# Patient Record
Sex: Female | Born: 1975 | Race: White | Hispanic: No | Marital: Married | Smoking: Current every day smoker
Health system: Southern US, Community
[De-identification: ages and names within clinical notes are randomized; demographics above are authoritative.]

## PROBLEM LIST (undated history)

## (undated) DIAGNOSIS — N644 Mastodynia: Secondary | ICD-10-CM

## (undated) DIAGNOSIS — L900 Lichen sclerosus et atrophicus: Secondary | ICD-10-CM

## (undated) DIAGNOSIS — D649 Anemia, unspecified: Secondary | ICD-10-CM

## (undated) DIAGNOSIS — F419 Anxiety disorder, unspecified: Secondary | ICD-10-CM

## (undated) DIAGNOSIS — F322 Major depressive disorder, single episode, severe without psychotic features: Secondary | ICD-10-CM

## (undated) DIAGNOSIS — J069 Acute upper respiratory infection, unspecified: Secondary | ICD-10-CM

## (undated) DIAGNOSIS — IMO0002 Reserved for concepts with insufficient information to code with codable children: Secondary | ICD-10-CM

## (undated) DIAGNOSIS — L409 Psoriasis, unspecified: Secondary | ICD-10-CM

## (undated) DIAGNOSIS — R87619 Unspecified abnormal cytological findings in specimens from cervix uteri: Secondary | ICD-10-CM

## (undated) DIAGNOSIS — H538 Other visual disturbances: Secondary | ICD-10-CM

## (undated) DIAGNOSIS — M1990 Unspecified osteoarthritis, unspecified site: Secondary | ICD-10-CM

## (undated) DIAGNOSIS — R339 Retention of urine, unspecified: Secondary | ICD-10-CM

## (undated) DIAGNOSIS — B069 Rubella without complication: Secondary | ICD-10-CM

## (undated) DIAGNOSIS — O09529 Supervision of elderly multigravida, unspecified trimester: Secondary | ICD-10-CM

## (undated) DIAGNOSIS — L4050 Arthropathic psoriasis, unspecified: Secondary | ICD-10-CM

## (undated) DIAGNOSIS — Z8619 Personal history of other infectious and parasitic diseases: Secondary | ICD-10-CM

## (undated) DIAGNOSIS — K644 Residual hemorrhoidal skin tags: Secondary | ICD-10-CM

## (undated) HISTORY — PX: LASIK: SHX215

## (undated) HISTORY — PX: CRYOTHERAPY: SHX1416

## (undated) HISTORY — DX: Retention of urine, unspecified: R33.9

## (undated) HISTORY — DX: Unspecified abnormal cytological findings in specimens from cervix uteri: R87.619

## (undated) HISTORY — DX: Mastodynia: N64.4

## (undated) HISTORY — DX: Major depressive disorder, single episode, severe without psychotic features: F32.2

## (undated) HISTORY — PX: FOOT SURGERY: SHX648

## (undated) HISTORY — DX: Other visual disturbances: H53.8

## (undated) HISTORY — DX: Residual hemorrhoidal skin tags: K64.4

## (undated) HISTORY — DX: Anxiety disorder, unspecified: F41.9

## (undated) HISTORY — DX: Reserved for concepts with insufficient information to code with codable children: IMO0002

## (undated) HISTORY — DX: Unspecified osteoarthritis, unspecified site: M19.90

## (undated) HISTORY — DX: Personal history of other infectious and parasitic diseases: Z86.19

## (undated) HISTORY — DX: Rubella without complication: B06.9

## (undated) HISTORY — DX: Anemia, unspecified: D64.9

## (undated) HISTORY — DX: Arthropathic psoriasis, unspecified: L40.50

## (undated) HISTORY — DX: Acute upper respiratory infection, unspecified: J06.9

## (undated) HISTORY — DX: Supervision of elderly multigravida, unspecified trimester: O09.529

---
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
## 2000-03-20 ENCOUNTER — Emergency Department (HOSPITAL_COMMUNITY): Admission: EM | Admit: 2000-03-20 | Discharge: 2000-03-20 | Payer: Self-pay | Admitting: Emergency Medicine

## 2000-03-20 ENCOUNTER — Encounter: Payer: Self-pay | Admitting: Emergency Medicine

## 2000-10-07 ENCOUNTER — Other Ambulatory Visit: Admission: RE | Admit: 2000-10-07 | Discharge: 2000-10-07 | Payer: Self-pay | Admitting: Obstetrics and Gynecology

## 2002-10-19 ENCOUNTER — Emergency Department (HOSPITAL_COMMUNITY): Admission: EM | Admit: 2002-10-19 | Discharge: 2002-10-19 | Payer: Self-pay | Admitting: Emergency Medicine

## 2004-02-26 ENCOUNTER — Emergency Department (HOSPITAL_COMMUNITY): Admission: EM | Admit: 2004-02-26 | Discharge: 2004-02-26 | Payer: Self-pay | Admitting: Emergency Medicine

## 2004-03-01 ENCOUNTER — Other Ambulatory Visit: Admission: RE | Admit: 2004-03-01 | Discharge: 2004-03-01 | Payer: Self-pay | Admitting: Obstetrics and Gynecology

## 2004-03-02 ENCOUNTER — Encounter (INDEPENDENT_AMBULATORY_CARE_PROVIDER_SITE_OTHER): Payer: Self-pay | Admitting: Specialist

## 2004-03-02 ENCOUNTER — Ambulatory Visit (HOSPITAL_COMMUNITY): Admission: RE | Admit: 2004-03-02 | Discharge: 2004-03-02 | Payer: Self-pay | Admitting: Obstetrics and Gynecology

## 2008-08-22 ENCOUNTER — Other Ambulatory Visit: Admission: RE | Admit: 2008-08-22 | Discharge: 2008-08-22 | Payer: Self-pay | Admitting: Obstetrics and Gynecology

## 2010-07-17 HISTORY — PX: DILATION AND CURETTAGE OF UTERUS: SHX78

## 2011-02-01 NOTE — Op Note (Signed)
NAME:  Lindsay Griffin, Lindsay Griffin                      ACCOUNT NO.:  [id number]   MEDICAL RECORD NO.:  [id number]                   PATIENT TYPE:  AMB   LOCATION:  SDC                                  FACILITY:  [facility]   PHYSICIAN:  Hal Morales, M.D.             DATE OF BIRTH:  [birth year]   DATE OF PROCEDURE:  03/02/2004  DATE OF DISCHARGE:                                 OPERATIVE REPORT   PREOPERATIVE DIAGNOSES:  Abnormal uterine bleeding, rule out retained  products of conception.   POSTOPERATIVE DIAGNOSES:  Abnormal uterine bleeding, rule out retained  products of conception.   OPERATION:  Suction dilatation and evacuation.   SURGEON:  Hal Morales, M.D.   ANESTHESIA:  Monitored anesthesia care and local.   ESTIMATED BLOOD LOSS:  Less than 10 mL.   COMPLICATIONS:  None.   FINDINGS:  A small amount of uterine contents.   DESCRIPTION OF PROCEDURE:  The patient was taken to the operating room after  appropriate identification and placed on the operating table.  After the  attainment of monitored anesthesia care, she was placed in the lithotomy  position. The perineum and vagina were prepped with multiple layers of  Betadine and draped as a sterile field. A Graves speculum was placed in the  vagina and a single tooth tenaculum placed on the anterior cervix. A  paracervical block was achieved with a total of 10 mL of 2% Xylocaine in the  5 and 7 o'clock positions. The cervix was dilated to accommodate a #7  suction curette. The suction catheter was used to suction evacuate all  quadrants of the uterus. A sharp curette was used to ensure that all uterine  contents were removed.  Hemostasis was noted to be adequate. The patient was  taken from the operating room to the recovery room in satisfactory condition  having tolerated the procedure well with sponge and instrument counts  correct.   SPECIMENS:  Uterine content.   DISCHARGE INSTRUCTIONS:  Printed instructions from the  [facility] for  a D&C.   DISCHARGE MEDICATIONS:  Doxycycline 100 mg p.o. b.i.d. for five days.  The  patient is Rh negative and received RhoGAM after her pregnancy termination  in February.   An antibody screen will be obtained to determine whether she has adequate  coverage as a result of that RhoGAM injection.                                              Hal Morales, M.D.   VPH/MEDQ  D:  03/02/2004  T:  03/03/2004  Job:  [id number]

## 2011-02-01 NOTE — H&P (Signed)
NAME:  Lindsay Griffin, Lindsay Griffin                        ACCOUNT NO.:  [id number]   MEDICAL RECORD NO.:  [id number]                   PATIENT TYPE:   LOCATION:                                       FACILITY:   PHYSICIAN:  Hal Morales, M.D.             DATE OF BIRTH:  May 28, 1976   DATE OF ADMISSION:  03/02/2004  DATE OF DISCHARGE:                                HISTORY & PHYSICAL   HISTORY OF PRESENT ILLNESS:  The patient is a 35 year old white single  female para 5,4,0,1,4 who presents for evaluation of excessive vaginal  bleeding since an elective pregnancy termination in February 2005.  The  patient had light bleeding for approximately four weeks after her procedure.  She did not return for her scheduled postoperative appointment.  After the  four weeks of bleeding, she discontinued any bleeding for a few weeks.  Then, in April 2005 started heavy bleeding that was similar to her normal  period.  This bleeding has continued to date.  She was evaluated two weeks  ago at [facility] and restarted on Ortho Tri-Cyclen Lo on February 19, 2004.  She continues to bleed inspite of having already taken approximately 10  pills of the Ortho Tri-Cyclen Lo.   Her elective pregnancy termination was followed by a prescription for  Doxycycline for five days.  She denies any significant abdominal pain  originally, but has started to have a bit of abdominal pain over the last  several days.  She denies any nausea, vomiting, constipation or diarrhea.  Her pain is crampy in nature.  She denies any fever at home.   PAST MEDICAL HISTORY:  Significant only for bladder infection many years  ago.   SURGICAL HISTORY:  She had surgery on her foot the summer of 1994 and has  had four elective pregnancy terminations.   CURRENT MEDICATIONS:  Ortho Tri-Cyclen Lo.   ALLERGIES:  SULFA.   PAST GYN HISTORY:  The patient started her menarche at age 66 and had  regular monthly menses lasting five to six days until  her first pregnancy.  Between pregnancies she likewise had normal monthly periods without any  heavy flow, bleeding between periods or severe cramps. She is currently  sexually active and has had more than five partners in her lifetime.  She is  satisfied with her birth control pills as a method of birth control.  She  denies a history of sexually transmitted infection.   SOCIAL HISTORY:  The patient works as a Leisure centre manager and smokes cigarettes, one  pack per day, and has for the last 12 years.   FAMILY HISTORY:  Positive for migraines and cerebrovascular accident as well  as cancer.   REVIEW OF SYSTEMS:  Positive for crampy abdominal pain and the  aforementioned vaginal bleeding.   PHYSICAL EXAMINATION:  GENERAL:  The patient is a thin white female in no  acute distress.  VITAL SIGNS:  Temperature 98.7,  blood pressure 90/60, weight 108.  LUNGS:  Clear.  HEART:  Regular  rate and rhythm.  ABDOMEN:  Soft without masses or organomegaly. There is mild tenderness to  deep palpation suprapubically, but no rebound.  PELVIC:  EG/BUS is within normal limits.  The vagina shows blood in the  vault.  The cervix is nontender to lateral motion.  The uterus is normal  size to six week size with moderate tenderness to compression.  The adnexa  without masses.  Rectovaginal is without masses.   LABORATORY DATA:  Hemoglobin 11.4, white blood cell count 8.4, quantitative  HCG is 6.3.  Pelvic ultrasound, transvaginal ultrasound:  The endometrial  cavity contains mixed ecogenetic material consistent with, but not  diagnostic of retained products of conception.  There is a simple 2.2 cm  right ovarian cyst.  The endometrial thickness is approximately 1.9 cm.   IMPRESSION:  Prolonged vaginal bleeding status post elective termination of  pregnancy.   DISPOSITION:  Discussion is held with the patient concerning the  differential diagnosis of her vaginal bleeding which includes retained  products of  conceptions, subinvolution, or endomyometritis.  She is given an  injection of Rocephin 250 mg IM and a D&E is recommended.  Discussion is  held with the patient concerning indications for her procedure as well as  the risk involved which include, but are not limited to anesthesia,  bleeding, infection, damage to adjacent organs; specifically, uterine  perforation.  The patient wishes to proceed with dilatation and evacuation  at [facility] on March 02, 2004.                                               Hal Morales, M.D.    VPH/MEDQ  D:  03/01/2004  T:  03/02/2004  Job:  [id number]

## 2011-09-17 NOTE — L&D Delivery Note (Signed)
Delivery Note At 4:24 PM a viable and healthy female was delivered via Vaginal, Spontaneous Delivery (Presentation: Left Occiput Anterior).  APGAR: 8, 9; weight pending.   Placenta status: spontaneous, intact .  Cord:  with the following complications: none.  Cord pH: na  Anesthesia: Epidural  Episiotomy: none Lacerations: second degree Suture Repair: 2.0 vicryl rapide Est. Blood Loss (mL): 300  Mom to postpartum.  Baby to nursery-stable.  Modest Draeger J 08/07/2012, 4:37 PM

## 2012-01-08 LAB — OB RESULTS CONSOLE ABO/RH: RH Type: POSITIVE

## 2012-01-08 LAB — OB RESULTS CONSOLE RUBELLA ANTIBODY, IGM: Rubella: IMMUNE

## 2012-01-08 LAB — OB RESULTS CONSOLE HEPATITIS B SURFACE ANTIGEN: Hepatitis B Surface Ag: NEGATIVE

## 2012-01-08 LAB — OB RESULTS CONSOLE RPR: RPR: NONREACTIVE

## 2012-01-08 LAB — OB RESULTS CONSOLE HIV ANTIBODY (ROUTINE TESTING): HIV: NONREACTIVE

## 2012-01-16 LAB — OB RESULTS CONSOLE GC/CHLAMYDIA
Chlamydia: NEGATIVE
Gonorrhea: NEGATIVE

## 2012-07-09 LAB — OB RESULTS CONSOLE GBS: GBS: NEGATIVE

## 2012-07-22 ENCOUNTER — Telehealth (HOSPITAL_COMMUNITY): Payer: Self-pay | Admitting: *Deleted

## 2012-07-22 ENCOUNTER — Encounter (HOSPITAL_COMMUNITY): Payer: Self-pay | Admitting: *Deleted

## 2012-07-22 NOTE — Telephone Encounter (Signed)
Preadmission screen  

## 2012-07-31 ENCOUNTER — Other Ambulatory Visit: Payer: Self-pay | Admitting: Obstetrics and Gynecology

## 2012-08-07 ENCOUNTER — Encounter (HOSPITAL_COMMUNITY): Payer: Self-pay | Admitting: Anesthesiology

## 2012-08-07 ENCOUNTER — Inpatient Hospital Stay (HOSPITAL_COMMUNITY): Payer: BC Managed Care – PPO | Admitting: Anesthesiology

## 2012-08-07 ENCOUNTER — Inpatient Hospital Stay (HOSPITAL_COMMUNITY)
Admission: RE | Admit: 2012-08-07 | Discharge: 2012-08-09 | DRG: 373 | Disposition: A | Discharge status: Home / Self Care | Payer: BC Managed Care – PPO | Source: Ambulatory Visit | Attending: Obstetrics and Gynecology | Admitting: Obstetrics and Gynecology

## 2012-08-07 ENCOUNTER — Encounter (HOSPITAL_COMMUNITY): Payer: Self-pay

## 2012-08-07 DIAGNOSIS — O09529 Supervision of elderly multigravida, unspecified trimester: Secondary | ICD-10-CM | POA: Diagnosis present

## 2012-08-07 LAB — CBC
MCV: 88.5 fL (ref 78.0–100.0)
Platelets: 407 10*3/uL — ABNORMAL HIGH (ref 150–400)
RBC: 3.73 MIL/uL — ABNORMAL LOW (ref 3.87–5.11)
RDW: 13 % (ref 11.5–15.5)
WBC: 14.6 10*3/uL — ABNORMAL HIGH (ref 4.0–10.5)

## 2012-08-07 LAB — RPR: RPR Ser Ql: NONREACTIVE

## 2012-08-07 MED ORDER — FENTANYL 2.5 MCG/ML BUPIVACAINE 1/10 % EPIDURAL INFUSION (WH - ANES)
14.0000 mL/h | INTRAMUSCULAR | Status: DC
  Administered 2012-08-07: 14 mL/h via EPIDURAL
  Filled 2012-08-07 (×2): qty 125

## 2012-08-07 MED ORDER — TERBUTALINE SULFATE 1 MG/ML IJ SOLN: 0.2500 mg | Freq: Once | INTRAMUSCULAR | Status: DC | PRN

## 2012-08-07 MED ORDER — OXYCODONE-ACETAMINOPHEN 5-325 MG PO TABS
1.0000 | ORAL_TABLET | ORAL | Status: DC | PRN
  Administered 2012-08-07: 1 via ORAL
  Filled 2012-08-07: qty 1

## 2012-08-07 MED ORDER — LACTATED RINGERS IV SOLN: 500.0000 mL | INTRAVENOUS | Status: DC | PRN

## 2012-08-07 MED ORDER — OXYCODONE-ACETAMINOPHEN 5-325 MG PO TABS: 1.0000 | ORAL_TABLET | ORAL | Status: DC | PRN

## 2012-08-07 MED ORDER — ACETAMINOPHEN 325 MG PO TABS: 650.0000 mg | ORAL_TABLET | ORAL | Status: DC | PRN

## 2012-08-07 MED ORDER — PRENATAL MULTIVITAMIN CH
1.0000 | ORAL_TABLET | Freq: Every day | ORAL | Status: DC
  Administered 2012-08-07 – 2012-08-08 (×2): 1 via ORAL
  Filled 2012-08-07 (×2): qty 1

## 2012-08-07 MED ORDER — CITRIC ACID-SODIUM CITRATE 334-500 MG/5ML PO SOLN: 30.0000 mL | ORAL | Status: DC | PRN

## 2012-08-07 MED ORDER — LACTATED RINGERS IV SOLN: INTRAVENOUS | Status: DC

## 2012-08-07 MED ORDER — ZOLPIDEM TARTRATE 5 MG PO TABS: 5.0000 mg | ORAL_TABLET | Freq: Every evening | ORAL | Status: DC | PRN

## 2012-08-07 MED ORDER — LIDOCAINE HCL (PF) 1 % IJ SOLN
INTRAMUSCULAR | Status: DC | PRN
  Administered 2012-08-07 (×2): 4 mL
  Administered 2012-08-07 (×2): 8 mL

## 2012-08-07 MED ORDER — FLEET ENEMA 7-19 GM/118ML RE ENEM: 1.0000 | ENEMA | RECTAL | Status: DC | PRN

## 2012-08-07 MED ORDER — OXYTOCIN BOLUS FROM INFUSION: 500.0000 mL | INTRAVENOUS | Status: DC

## 2012-08-07 MED ORDER — LIDOCAINE HCL (PF) 1 % IJ SOLN
30.0000 mL | INTRAMUSCULAR | Status: DC | PRN
  Filled 2012-08-07: qty 30

## 2012-08-07 MED ORDER — PHENYLEPHRINE 40 MCG/ML (10ML) SYRINGE FOR IV PUSH (FOR BLOOD PRESSURE SUPPORT)
80.0000 ug | PREFILLED_SYRINGE | INTRAVENOUS | Status: DC | PRN
  Filled 2012-08-07: qty 5

## 2012-08-07 MED ORDER — WITCH HAZEL-GLYCERIN EX PADS
1.0000 "application " | MEDICATED_PAD | CUTANEOUS | Status: DC | PRN
  Administered 2012-08-08: 1 via TOPICAL

## 2012-08-07 MED ORDER — PHENYLEPHRINE 40 MCG/ML (10ML) SYRINGE FOR IV PUSH (FOR BLOOD PRESSURE SUPPORT): 80.0000 ug | PREFILLED_SYRINGE | INTRAVENOUS | Status: DC | PRN

## 2012-08-07 MED ORDER — ONDANSETRON HCL 4 MG PO TABS: 4.0000 mg | ORAL_TABLET | ORAL | Status: DC | PRN

## 2012-08-07 MED ORDER — ONDANSETRON HCL 4 MG/2ML IJ SOLN: 4.0000 mg | Freq: Four times a day (QID) | INTRAMUSCULAR | Status: DC | PRN

## 2012-08-07 MED ORDER — ONDANSETRON HCL 4 MG/2ML IJ SOLN: 4.0000 mg | INTRAMUSCULAR | Status: DC | PRN

## 2012-08-07 MED ORDER — EPHEDRINE 5 MG/ML INJ
10.0000 mg | INTRAVENOUS | Status: DC | PRN
  Filled 2012-08-07: qty 4

## 2012-08-07 MED ORDER — LACTATED RINGERS IV SOLN
INTRAVENOUS | Status: DC
  Administered 2012-08-07 (×2): 125 mL via INTRAVENOUS

## 2012-08-07 MED ORDER — OXYTOCIN 40 UNITS IN LACTATED RINGERS INFUSION - SIMPLE MED: 1.0000 m[IU]/min | INTRAVENOUS | Status: DC

## 2012-08-07 MED ORDER — OXYTOCIN 40 UNITS IN LACTATED RINGERS INFUSION - SIMPLE MED: 62.5000 mL/h | INTRAVENOUS | Status: DC

## 2012-08-07 MED ORDER — FENTANYL 2.5 MCG/ML BUPIVACAINE 1/10 % EPIDURAL INFUSION (WH - ANES)
INTRAMUSCULAR | Status: DC | PRN
  Administered 2012-08-07: 14 mL/h via EPIDURAL

## 2012-08-07 MED ORDER — IBUPROFEN 600 MG PO TABS
600.0000 mg | ORAL_TABLET | Freq: Four times a day (QID) | ORAL | Status: DC
  Administered 2012-08-07 – 2012-08-09 (×5): 600 mg via ORAL
  Filled 2012-08-07 (×5): qty 1

## 2012-08-07 MED ORDER — IBUPROFEN 600 MG PO TABS: 600.0000 mg | ORAL_TABLET | Freq: Four times a day (QID) | ORAL | Status: DC | PRN

## 2012-08-07 MED ORDER — METHYLERGONOVINE MALEATE 0.2 MG/ML IJ SOLN: 0.2000 mg | INTRAMUSCULAR | Status: DC | PRN

## 2012-08-07 MED ORDER — BENZOCAINE-MENTHOL 20-0.5 % EX AERO
1.0000 "application " | INHALATION_SPRAY | CUTANEOUS | Status: DC | PRN
  Administered 2012-08-08: 1 via TOPICAL
  Filled 2012-08-07: qty 56

## 2012-08-07 MED ORDER — EPHEDRINE 5 MG/ML INJ: 10.0000 mg | INTRAVENOUS | Status: DC | PRN

## 2012-08-07 MED ORDER — SENNOSIDES-DOCUSATE SODIUM 8.6-50 MG PO TABS
2.0000 | ORAL_TABLET | Freq: Every day | ORAL | Status: DC
  Administered 2012-08-07 – 2012-08-08 (×2): 2 via ORAL

## 2012-08-07 MED ORDER — DIPHENHYDRAMINE HCL 50 MG/ML IJ SOLN: 12.5000 mg | INTRAMUSCULAR | Status: DC | PRN

## 2012-08-07 MED ORDER — METHYLERGONOVINE MALEATE 0.2 MG PO TABS: 0.2000 mg | ORAL_TABLET | ORAL | Status: DC | PRN

## 2012-08-07 MED ORDER — LIDOCAINE HCL (PF) 1 % IJ SOLN: 30.0000 mL | INTRAMUSCULAR | Status: DC | PRN

## 2012-08-07 MED ORDER — TETANUS-DIPHTH-ACELL PERTUSSIS 5-2.5-18.5 LF-MCG/0.5 IM SUSP
0.5000 mL | Freq: Once | INTRAMUSCULAR | Status: AC
Start: 2012-08-08 — End: 2012-08-08
  Administered 2012-08-08: 0.5 mL via INTRAMUSCULAR

## 2012-08-07 MED ORDER — SIMETHICONE 80 MG PO CHEW: 80.0000 mg | CHEWABLE_TABLET | ORAL | Status: DC | PRN

## 2012-08-07 MED ORDER — LANOLIN HYDROUS EX OINT: TOPICAL_OINTMENT | CUTANEOUS | Status: DC | PRN

## 2012-08-07 MED ORDER — OXYTOCIN 40 UNITS IN LACTATED RINGERS INFUSION - SIMPLE MED
1.0000 m[IU]/min | INTRAVENOUS | Status: DC
  Administered 2012-08-07: 2 m[IU]/min via INTRAVENOUS
  Filled 2012-08-07: qty 1000

## 2012-08-07 MED ORDER — LACTATED RINGERS IV SOLN
500.0000 mL | Freq: Once | INTRAVENOUS | Status: AC
  Administered 2012-08-07: 500 mL via INTRAVENOUS

## 2012-08-07 MED ORDER — DIPHENHYDRAMINE HCL 25 MG PO CAPS: 25.0000 mg | ORAL_CAPSULE | Freq: Four times a day (QID) | ORAL | Status: DC | PRN

## 2012-08-07 MED ORDER — DIBUCAINE 1 % RE OINT
1.0000 "application " | TOPICAL_OINTMENT | RECTAL | Status: DC | PRN
  Administered 2012-08-08: 1 via RECTAL
  Filled 2012-08-07: qty 28

## 2012-08-07 NOTE — Anesthesia Procedure Notes (Addendum)
Epidural Patient location during procedure: OB Start time: 08/07/2012 9:41 AM End time: 08/07/2012 9:45 AM  Staffing Anesthesiologist: Sandrea Hughs Performed by: anesthesiologist   Preanesthetic Checklist Completed: patient identified, site marked, surgical consent, pre-op evaluation, timeout performed, IV checked, risks and benefits discussed and monitors and equipment checked  Epidural Patient position: sitting Prep: site prepped and draped and DuraPrep Patient monitoring: continuous pulse ox and blood pressure Approach: midline Injection technique: LOR air  Needle:  Needle type: Tuohy  Needle gauge: 17 G Needle length: 9 cm and 9 Needle insertion depth: 4 cm Catheter type: closed end flexible Catheter size: 19 Gauge Catheter at skin depth: 9 cm Test dose: negative and Other  Assessment Sensory level: T8 Events: blood not aspirated, injection not painful, no injection resistance, negative IV test and no paresthesia  Additional Notes Reason for block:procedure for pain  Epidural Patient location during procedure: OB Start time: 08/07/2012 10:42 AM  Staffing Anesthesiologist: Malen Gauze, Nashae Maudlin A. Performed by: anesthesiologist   Preanesthetic Checklist Completed: patient identified, site marked, surgical consent, pre-op evaluation, timeout performed, IV checked, risks and benefits discussed and monitors and equipment checked  Epidural Patient position: sitting Prep: site prepped and draped and DuraPrep Patient monitoring: continuous pulse ox and blood pressure Approach: midline Injection technique: LOR air  Needle:  Needle type: Tuohy  Needle gauge: 17 G Needle length: 9 cm and 9 Needle insertion depth: 5 cm cm Catheter type: closed end flexible Catheter size: 19 Gauge Catheter at skin depth: 10 cm Test dose: negative and Other  Assessment Events: blood not aspirated, injection not painful, no injection resistance, negative IV test and no  paresthesia  Additional Notes Patient identified. Risks and benefits discussed including failed block, incomplete  Pain control, post dural puncture headache, nerve damage, paralysis, blood pressure Changes, nausea, vomiting, reactions to medications-both toxic and allergic and post Partum back pain. All questions were answered. Patient expressed understanding and wished to proceed. Sterile technique was used throughout procedure. Epidural site was Dressed with sterile barrier dressing. No paresthesias, signs of intravascular injection Or signs of intrathecal spread were encountered.  Patient was more comfortable after the epidural was dosed. Please see RN's note for documentation of vital signs and FHR which are stable.

## 2012-08-07 NOTE — Progress Notes (Signed)
Dr. Billy Coast in OR. Should be done in 15 min. Would like pt to wait on pushing until he has finished in OR.

## 2012-08-07 NOTE — Anesthesia Preprocedure Evaluation (Signed)
Anesthesia Evaluation  Patient identified by MRN, date of birth, ID band Patient awake    Reviewed: Allergy & Precautions, H&P , NPO status , Patient's Chart, lab work & pertinent test results  Airway Mallampati: I TM Distance: >3 FB Neck ROM: full    Dental No notable dental hx.    Pulmonary neg pulmonary ROS,  breath sounds clear to auscultation  Pulmonary exam normal       Cardiovascular negative cardio ROS      Neuro/Psych negative neurological ROS  negative psych ROS   GI/Hepatic negative GI ROS, Neg liver ROS,   Endo/Other  negative endocrine ROS  Renal/GU negative Renal ROS  negative genitourinary   Musculoskeletal negative musculoskeletal ROS (+)   Abdominal Normal abdominal exam  (+)   Peds negative pediatric ROS (+)  Hematology negative hematology ROS (+)   Anesthesia Other Findings   Reproductive/Obstetrics (+) Pregnancy                           Anesthesia Physical Anesthesia Plan  ASA: II  Anesthesia Plan: Epidural   Post-op Pain Management:    Induction:   Airway Management Planned:   Additional Equipment:   Intra-op Plan:   Post-operative Plan:   Informed Consent: I have reviewed the patients History and Physical, chart, labs and discussed the procedure including the risks, benefits and alternatives for the proposed anesthesia with the patient or authorized representative who has indicated his/her understanding and acceptance.     Plan Discussed with:   Anesthesia Plan Comments:         Anesthesia Quick Evaluation  

## 2012-08-07 NOTE — Progress Notes (Signed)
Lindsay Griffin is a 36 y.o. [id number] at [redacted]w[redacted]d by LMP admitted for induction of labor due to Elective at term.  Subjective: Comfortable  Objective: BP 146/92  Pulse 74  Temp 97.9 F (36.6 C) (Oral)  Ht 5\' 2"  (1.575 m)  Wt 63.957 kg (141 lb)  BMI 25.79 kg/m2  SpO2 100%  LMP 11/06/2011      FHT:  FHR: 155 bpm, variability: moderate,  accelerations:  Present,  decelerations:  Absent UC:   regular, every 2-3 minutes SVE:   Dilation: 9 Effacement (%): 100 Station: +1 Exam by:: Dr. Billy Coast   Labs: Lab Results  Component Value Date   WBC 14.6* 08/07/2012   HGB 11.1* 08/07/2012   HCT 33.0* 08/07/2012   MCV 88.5 08/07/2012   PLT 407* 08/07/2012    Assessment / Plan: Induction of labor due to social factors,  progressing well on pitocin  Labor: Progressing normally Preeclampsia:  na Fetal Wellbeing:  Category I Pain Control:  Epidural I/D:  n/a Anticipated MOD:  NSVD  Ragena Fiola J 08/07/2012, 2:21 PM

## 2012-08-08 LAB — CBC
HCT: 28.6 % — ABNORMAL LOW (ref 36.0–46.0)
MCH: 29.7 pg (ref 26.0–34.0)
MCHC: 33.6 g/dL (ref 30.0–36.0)
MCV: 88.5 fL (ref 78.0–100.0)
RDW: 12.9 % (ref 11.5–15.5)

## 2012-08-08 MED ORDER — POLYSACCHARIDE IRON COMPLEX 150 MG PO CAPS
150.0000 mg | ORAL_CAPSULE | Freq: Every day | ORAL | Status: DC
  Filled 2012-08-08 (×2): qty 1

## 2012-08-08 NOTE — H&P (Signed)
NAME:  Lindsay Griffin, Lindsay Griffin NO.:  [id number]  MEDICAL RECORD NO.:  [id number]  LOCATION:  9115                          FACILITY:  [facility]  PHYSICIAN:  Lenoard Aden, M.D.DATE OF BIRTH:  12-24-1975  DATE OF ADMISSION:  08/07/2012 DATE OF DISCHARGE:                             HISTORY & PHYSICAL   CHIEF COMPLAINT:  Induction at 39 weeks, husband employed in [place] and is scheduled to come home for birth of child and due to favorable cervix and husband's geographical assistance restrictions, we will proceed with induction.  She is a 36 year old white female, G4, P1, at 39 weeks and 2/7th week days gestation for induction at term due to aforementioned indications.  ALLERGIES:  SULFA.  MEDICATIONS:  Prenatal vitamins.  SOCIAL HISTORY:  She is a nonsmoker, nondrinker.  Denies domestic or physical violence.  She has a history of hemorrhoids.  She has a social history that is noncontributory.  FAMILY HISTORY:  Stroke, heart disease, colon cancer, migraine headaches, and hypertension.  Previous delivery of a 6 pounds 7 ounce fetus on July 04, 1997.  PREVIOUS SURGICAL HISTORY:  Cryotherapy in 1994, missed AB in 2011.  MEDICATIONS:  Vitamin B6, folic acid, and prenatal vitamins.  PHYSICAL EXAMINATION:  GENERAL:  This is a well-developed, well- nourished white female, in no acute distress. HEENT:  Normal. NECK:  Supple.  Full range of motion. LUNGS:  Clear. HEART:  Regular rhythm. ABDOMEN:  Soft, gravid, nontender.  Estimated fetal weight 6-1/2 to 7 pounds.  Cervix is 3 cm, 80%, vertex -1. EXTREMITIES:  There are no cords. NEUROLOGIC:  Nonfocal. SKIN:  Intact.  IMPRESSION:  A 39-week intrauterine pregnancy with social factors and geographic issues involving her husband with favorable cervix for induction.  PLAN:  Pitocin, epidural.  Anticipate attempts at vaginal delivery.     Lenoard Aden, M.D.     RJT/MEDQ  D:  08/07/2012  T:   08/08/2012  Job:  [id number]

## 2012-08-08 NOTE — Anesthesia Postprocedure Evaluation (Signed)
  Anesthesia Post-op Note  Patient: Lindsay Griffin  Procedure(s) Performed: * No procedures listed *  Patient Location: PACU and Mother/Baby  Anesthesia Type:Epidural  Level of Consciousness: awake, alert  and oriented  Airway and Oxygen Therapy: Patient Spontanous Breathing    Post-op Assessment: Patient's Cardiovascular Status Stable and Respiratory Function Stable  Post-op Vital Signs: stable  Complications: No apparent anesthesia complications

## 2012-08-08 NOTE — Progress Notes (Signed)
PPD 1 SVD  S:  Reports feeling ok - just really sore in her bottom             Tolerating po/ No nausea or vomiting             Bleeding is light             Pain controlled with motrin and percocet             Up ad lib / ambulatory  Newborn bottle feeding  / female baby  O:               VS: BP 131/71  Pulse 66  Temp 98.1 F (36.7 C) (Oral)  Resp 18  Ht 5\' 2"  (1.575 m)  Wt 63.957 kg (141 lb)  BMI 25.79 kg/m2  SpO2 99%  LMP 11/06/2011  Breastfeeding? Unknown   LABS:  Basename 08/08/12 0541 08/07/12 0745  WBC 20.2* 14.6*  HGB 9.6* 11.1*  PLT 369 407*                                      Physical Exam:             Alert and oriented X3  Lungs: Clear and unlabored  Heart: regular rate and rhythm  Abdomen: soft, non-tender, non-distended              Fundus: firm, non-tender, U-1  Perineum: mild edema  Lochia: light  Extremities: no edema, no calf pain or tenderness    A: PPD # 1   Doing well - stable status  P:  Routine post partum orders  D/c in am - review engorgement support since bottlefeeding  Marlinda Mike CNM, MSN 08/08/2012, 12:14 PM

## 2012-08-09 ENCOUNTER — Encounter (HOSPITAL_COMMUNITY): Payer: Self-pay

## 2012-08-09 MED ORDER — POLYSACCHARIDE IRON COMPLEX 150 MG PO CAPS: 150.0000 mg | ORAL_CAPSULE | Freq: Every day | ORAL | Status: DC

## 2012-08-09 MED ORDER — TRAMADOL-ACETAMINOPHEN 37.5-325 MG PO TABS: 1.0000 | ORAL_TABLET | Freq: Four times a day (QID) | ORAL | Status: DC | PRN

## 2012-08-09 MED ORDER — NAPROXEN SODIUM 220 MG PO TABS: ORAL_TABLET | ORAL | Status: DC

## 2012-08-09 NOTE — Discharge Summary (Signed)
Obstetric Discharge Summary  Reason for Admission: induction of labor Prenatal Procedures: none Intrapartum Procedures: spontaneous vaginal delivery Postpartum Procedures: none Complications-Operative and Postpartum: 2nd degree perineal laceration Hemoglobin  Date Value Range Status  08/08/2012 9.6* 12.0 - 15.0 g/dL Final     HCT  Date Value Range Status  08/08/2012 28.6* 36.0 - 46.0 % Final    Physical Exam:  General: alert, cooperative and no distress Lochia: appropriate Uterine Fundus: firm Incision: healing well DVT Evaluation: No evidence of DVT seen on physical exam.  Discharge Diagnoses: Term Pregnancy-delivered / ABL anemia  Discharge Information: Date: 08/09/2012 Activity: pelvic rest Diet: routine Medications: Womens One-Day prenatal / Niferex 150 x 1 month / Aleve OTC 1-2 tab every 12 hrs / Ultracet (tramadol and tylenol) every 6 hours prn breakthru cramps Condition: stable Instructions: refer to practice specific booklet Discharge to: home Follow-up Information    Follow up with Lenoard Aden, MD. Schedule an appointment as soon as possible for a visit in 6 weeks.   Contact information:   [address] [place] [address] [phone]          Newborn Data: Live born female  Birth Weight: 7 lb 10 oz (3459 g) APGAR: 8, 9  Home with mother.  Lindsay Griffin 08/09/2012, 9:39 AM

## 2012-08-09 NOTE — Progress Notes (Signed)
PPD 2 SVD  S:  Reports feeling lots of cramps - prefers Aleve to Motrin for discharge              Tolerating po/ No nausea or vomiting             Bleeding is moderate             Pain uncontrolled with motrin and percocet too sedating             Up ad lib / ambulatory / voiding QS / no BM yet - hx IBS constipation prone - uses Miralax if no BM after 3 days  Newborn bottle-feeding    O:               VS: BP 127/81  Pulse 69  Temp 98.5 F (36.9 C) (Oral)  Resp 18  Ht 5\' 2"  (1.575 m)  Wt 63.957 kg (141 lb)  BMI 25.79 kg/m2  SpO2 99%  LMP 11/06/2011  Breastfeeding? Unknown   LABS:  Basename 08/08/12 0541 08/07/12 0745  WBC 20.2* 14.6*  HGB 9.6* 11.1*  PLT 369 407*                                          I&O:                                         Physical Exam:             Alert and oriented X3  Lungs: Clear and unlabored  Heart: regular rate and rhythm / no mumurs  Abdomen: soft, non-tender, non-distended              Fundus: firm, non-tender, U-1  Perineum: no edema  Lochia: light  Extremities: no edema, no calf pain or tenderness    A: PPD # 2   Doing well - stable status  P:  Routine post partum orders  Discharge to home  Marlinda Mike CNM, MSN 08/09/2012, 9:30 AM

## 2012-10-15 ENCOUNTER — Encounter (INDEPENDENT_AMBULATORY_CARE_PROVIDER_SITE_OTHER): Payer: Self-pay | Admitting: Ophthalmology

## 2012-10-19 ENCOUNTER — Encounter (INDEPENDENT_AMBULATORY_CARE_PROVIDER_SITE_OTHER): Payer: BC Managed Care – PPO | Admitting: Ophthalmology

## 2012-10-19 DIAGNOSIS — H33309 Unspecified retinal break, unspecified eye: Secondary | ICD-10-CM

## 2012-10-19 DIAGNOSIS — H43819 Vitreous degeneration, unspecified eye: Secondary | ICD-10-CM

## 2012-10-19 DIAGNOSIS — H16019 Central corneal ulcer, unspecified eye: Secondary | ICD-10-CM

## 2012-10-19 DIAGNOSIS — H2510 Age-related nuclear cataract, unspecified eye: Secondary | ICD-10-CM

## 2012-10-19 DIAGNOSIS — H3581 Retinal edema: Secondary | ICD-10-CM

## 2012-10-23 DIAGNOSIS — H18829 Corneal disorder due to contact lens, unspecified eye: Secondary | ICD-10-CM | POA: Insufficient documentation

## 2012-10-23 DIAGNOSIS — H5210 Myopia, unspecified eye: Secondary | ICD-10-CM | POA: Insufficient documentation

## 2012-11-04 ENCOUNTER — Ambulatory Visit (INDEPENDENT_AMBULATORY_CARE_PROVIDER_SITE_OTHER): Payer: BC Managed Care – PPO | Admitting: Ophthalmology

## 2012-11-04 DIAGNOSIS — H33309 Unspecified retinal break, unspecified eye: Secondary | ICD-10-CM

## 2012-11-04 DIAGNOSIS — H43819 Vitreous degeneration, unspecified eye: Secondary | ICD-10-CM

## 2012-11-04 DIAGNOSIS — H35419 Lattice degeneration of retina, unspecified eye: Secondary | ICD-10-CM

## 2012-11-04 DIAGNOSIS — H538 Other visual disturbances: Secondary | ICD-10-CM

## 2012-11-10 ENCOUNTER — Encounter (INDEPENDENT_AMBULATORY_CARE_PROVIDER_SITE_OTHER): Payer: BC Managed Care – PPO | Admitting: Ophthalmology

## 2012-11-10 DIAGNOSIS — H33309 Unspecified retinal break, unspecified eye: Secondary | ICD-10-CM

## 2012-11-18 ENCOUNTER — Ambulatory Visit (INDEPENDENT_AMBULATORY_CARE_PROVIDER_SITE_OTHER): Payer: BC Managed Care – PPO | Admitting: Ophthalmology

## 2012-11-18 DIAGNOSIS — H33309 Unspecified retinal break, unspecified eye: Secondary | ICD-10-CM

## 2012-11-25 ENCOUNTER — Other Ambulatory Visit: Payer: Self-pay

## 2013-03-29 ENCOUNTER — Ambulatory Visit (INDEPENDENT_AMBULATORY_CARE_PROVIDER_SITE_OTHER): Payer: Self-pay | Admitting: Ophthalmology

## 2013-04-09 ENCOUNTER — Ambulatory Visit (INDEPENDENT_AMBULATORY_CARE_PROVIDER_SITE_OTHER): Payer: BC Managed Care – PPO | Admitting: Ophthalmology

## 2013-04-09 DIAGNOSIS — H43819 Vitreous degeneration, unspecified eye: Secondary | ICD-10-CM

## 2013-04-09 DIAGNOSIS — H33309 Unspecified retinal break, unspecified eye: Secondary | ICD-10-CM

## 2013-04-09 DIAGNOSIS — H5210 Myopia, unspecified eye: Secondary | ICD-10-CM

## 2014-07-18 ENCOUNTER — Encounter (HOSPITAL_COMMUNITY): Payer: Self-pay

## 2014-09-03 ENCOUNTER — Encounter (HOSPITAL_COMMUNITY): Payer: Self-pay

## 2014-09-03 ENCOUNTER — Inpatient Hospital Stay (HOSPITAL_COMMUNITY)
Admission: AD | Admit: 2014-09-03 | Discharge: 2014-09-05 | DRG: 897 | Disposition: A | Discharge status: Home / Self Care | Payer: BC Managed Care – PPO | Source: Intra-hospital | Attending: Psychiatry | Admitting: Psychiatry

## 2014-09-03 ENCOUNTER — Emergency Department (HOSPITAL_COMMUNITY)
Admission: EM | Admit: 2014-09-03 | Discharge: 2014-09-03 | Disposition: A | Discharge status: Other Acute Inpatient Hospital | Payer: BC Managed Care – PPO | Attending: Emergency Medicine | Admitting: Emergency Medicine

## 2014-09-03 DIAGNOSIS — T50901A Poisoning by unspecified drugs, medicaments and biological substances, accidental (unintentional), initial encounter: Secondary | ICD-10-CM | POA: Diagnosis present

## 2014-09-03 DIAGNOSIS — Y929 Unspecified place or not applicable: Secondary | ICD-10-CM | POA: Insufficient documentation

## 2014-09-03 DIAGNOSIS — T424X2A Poisoning by benzodiazepines, intentional self-harm, initial encounter: Secondary | ICD-10-CM | POA: Insufficient documentation

## 2014-09-03 DIAGNOSIS — R45851 Suicidal ideations: Secondary | ICD-10-CM | POA: Diagnosis present

## 2014-09-03 DIAGNOSIS — T50902A Poisoning by unspecified drugs, medicaments and biological substances, intentional self-harm, initial encounter: Secondary | ICD-10-CM | POA: Diagnosis present

## 2014-09-03 DIAGNOSIS — Z87891 Personal history of nicotine dependence: Secondary | ICD-10-CM

## 2014-09-03 DIAGNOSIS — F339 Major depressive disorder, recurrent, unspecified: Secondary | ICD-10-CM | POA: Insufficient documentation

## 2014-09-03 DIAGNOSIS — F10230 Alcohol dependence with withdrawal, uncomplicated: Secondary | ICD-10-CM

## 2014-09-03 DIAGNOSIS — F329 Major depressive disorder, single episode, unspecified: Secondary | ICD-10-CM

## 2014-09-03 DIAGNOSIS — F332 Major depressive disorder, recurrent severe without psychotic features: Secondary | ICD-10-CM

## 2014-09-03 DIAGNOSIS — Z3202 Encounter for pregnancy test, result negative: Secondary | ICD-10-CM | POA: Insufficient documentation

## 2014-09-03 DIAGNOSIS — F322 Major depressive disorder, single episode, severe without psychotic features: Secondary | ICD-10-CM | POA: Diagnosis present

## 2014-09-03 DIAGNOSIS — F1020 Alcohol dependence, uncomplicated: Secondary | ICD-10-CM | POA: Insufficient documentation

## 2014-09-03 DIAGNOSIS — F10239 Alcohol dependence with withdrawal, unspecified: Principal | ICD-10-CM | POA: Diagnosis present

## 2014-09-03 DIAGNOSIS — F4311 Post-traumatic stress disorder, acute: Secondary | ICD-10-CM | POA: Diagnosis present

## 2014-09-03 DIAGNOSIS — Y906 Blood alcohol level of 120-199 mg/100 ml: Secondary | ICD-10-CM | POA: Diagnosis present

## 2014-09-03 DIAGNOSIS — Y9389 Activity, other specified: Secondary | ICD-10-CM | POA: Insufficient documentation

## 2014-09-03 DIAGNOSIS — IMO0001 Reserved for inherently not codable concepts without codable children: Secondary | ICD-10-CM | POA: Insufficient documentation

## 2014-09-03 DIAGNOSIS — Z8719 Personal history of other diseases of the digestive system: Secondary | ICD-10-CM | POA: Diagnosis not present

## 2014-09-03 DIAGNOSIS — Y998 Other external cause status: Secondary | ICD-10-CM | POA: Insufficient documentation

## 2014-09-03 HISTORY — DX: Major depressive disorder, single episode, severe without psychotic features: F32.2

## 2014-09-03 HISTORY — DX: Major depressive disorder, recurrent, unspecified: F33.9

## 2014-09-03 LAB — COMPREHENSIVE METABOLIC PANEL
ALK PHOS: 54 U/L (ref 39–117)
ALT: 13 U/L (ref 0–35)
AST: 33 U/L (ref 0–37)
Albumin: 3.7 g/dL (ref 3.5–5.2)
Anion gap: 18 — ABNORMAL HIGH (ref 5–15)
BILIRUBIN TOTAL: 0.4 mg/dL (ref 0.3–1.2)
BUN: 7 mg/dL (ref 6–23)
CHLORIDE: 95 meq/L — AB (ref 96–112)
CO2: 23 meq/L (ref 19–32)
Calcium: 8.7 mg/dL (ref 8.4–10.5)
Creatinine, Ser: 0.6 mg/dL (ref 0.50–1.10)
GFR calc non Af Amer: >90 mL/min (ref >90)
GLUCOSE: 71 mg/dL (ref 70–99)
POTASSIUM: 4.3 meq/L (ref 3.7–5.3)
SODIUM: 136 meq/L — AB (ref 137–147)
TOTAL PROTEIN: 6.6 g/dL (ref 6.0–8.3)

## 2014-09-03 LAB — RAPID URINE DRUG SCREEN, HOSP PERFORMED
Amphetamines: NOT DETECTED
BENZODIAZEPINES: NOT DETECTED
Barbiturates: NOT DETECTED
COCAINE: NOT DETECTED
Opiates: NOT DETECTED
Tetrahydrocannabinol: NOT DETECTED

## 2014-09-03 LAB — ETHANOL: Alcohol, Ethyl (B): 194 mg/dL — ABNORMAL HIGH (ref 0–11)

## 2014-09-03 LAB — CBC
HEMATOCRIT: 43.6 % (ref 36.0–46.0)
HEMOGLOBIN: 14.7 g/dL (ref 12.0–15.0)
MCH: 32.2 pg (ref 26.0–34.0)
MCHC: 33.7 g/dL (ref 30.0–36.0)
MCV: 95.4 fL (ref 78.0–100.0)
Platelets: 243 10*3/uL (ref 150–400)
RBC: 4.57 MIL/uL (ref 3.87–5.11)
RDW: 12.6 % (ref 11.5–15.5)
WBC: 7.2 10*3/uL (ref 4.0–10.5)

## 2014-09-03 LAB — TSH: TSH: 1.02 u[IU]/mL (ref 0.350–4.500)

## 2014-09-03 LAB — POC URINE PREG, ED: Preg Test, Ur: NEGATIVE

## 2014-09-03 LAB — SALICYLATE LEVEL: Salicylate Lvl: <2 mg/dL — ABNORMAL LOW (ref 2.8–20.0)

## 2014-09-03 LAB — ACETAMINOPHEN LEVEL

## 2014-09-03 MED ORDER — LORAZEPAM 1 MG PO TABS: 0.0000 mg | ORAL_TABLET | Freq: Four times a day (QID) | ORAL | Status: DC

## 2014-09-03 MED ORDER — ADULT MULTIVITAMIN W/MINERALS CH
1.0000 | ORAL_TABLET | Freq: Every day | ORAL | Status: DC
  Administered 2014-09-03 – 2014-09-05 (×3): 1 via ORAL
  Filled 2014-09-03 (×6): qty 1

## 2014-09-03 MED ORDER — IBUPROFEN 200 MG PO TABS: 600.0000 mg | ORAL_TABLET | Freq: Three times a day (TID) | ORAL | Status: DC | PRN

## 2014-09-03 MED ORDER — VITAMIN B-1 100 MG PO TABS
100.0000 mg | ORAL_TABLET | Freq: Every day | ORAL | Status: DC
  Administered 2014-09-04 – 2014-09-05 (×2): 100 mg via ORAL
  Filled 2014-09-03 (×6): qty 1

## 2014-09-03 MED ORDER — CHLORDIAZEPOXIDE HCL 25 MG PO CAPS: 25.0000 mg | ORAL_CAPSULE | ORAL | Status: DC

## 2014-09-03 MED ORDER — ONDANSETRON 4 MG PO TBDP: 4.0000 mg | ORAL_TABLET | Freq: Four times a day (QID) | ORAL | Status: DC | PRN

## 2014-09-03 MED ORDER — ALUM & MAG HYDROXIDE-SIMETH 200-200-20 MG/5ML PO SUSP: 30.0000 mL | ORAL | Status: DC | PRN

## 2014-09-03 MED ORDER — CHLORDIAZEPOXIDE HCL 25 MG PO CAPS: 25.0000 mg | ORAL_CAPSULE | Freq: Four times a day (QID) | ORAL | Status: DC | PRN

## 2014-09-03 MED ORDER — CHLORDIAZEPOXIDE HCL 25 MG PO CAPS
25.0000 mg | ORAL_CAPSULE | Freq: Three times a day (TID) | ORAL | Status: DC
  Administered 2014-09-05 (×2): 25 mg via ORAL
  Filled 2014-09-03 (×2): qty 1

## 2014-09-03 MED ORDER — LORAZEPAM 1 MG PO TABS: 0.0000 mg | ORAL_TABLET | Freq: Two times a day (BID) | ORAL | Status: DC

## 2014-09-03 MED ORDER — SODIUM CHLORIDE 0.9 % IV BOLUS (SEPSIS)
1000.0000 mL | Freq: Once | INTRAVENOUS | Status: AC
  Administered 2014-09-03: 1000 mL via INTRAVENOUS

## 2014-09-03 MED ORDER — CHLORDIAZEPOXIDE HCL 25 MG PO CAPS
25.0000 mg | ORAL_CAPSULE | Freq: Four times a day (QID) | ORAL | Status: AC
  Administered 2014-09-03 – 2014-09-04 (×5): 25 mg via ORAL
  Filled 2014-09-03 (×5): qty 1

## 2014-09-03 MED ORDER — MAGNESIUM HYDROXIDE 400 MG/5ML PO SUSP: 30.0000 mL | Freq: Every day | ORAL | Status: DC | PRN

## 2014-09-03 MED ORDER — NICOTINE 21 MG/24HR TD PT24: 21.0000 mg | MEDICATED_PATCH | Freq: Every day | TRANSDERMAL | Status: DC

## 2014-09-03 MED ORDER — THIAMINE HCL 100 MG/ML IJ SOLN
100.0000 mg | Freq: Every day | INTRAMUSCULAR | Status: DC
  Administered 2014-09-03: 100 mg via INTRAVENOUS
  Filled 2014-09-03: qty 2

## 2014-09-03 MED ORDER — LOPERAMIDE HCL 2 MG PO CAPS: 2.0000 mg | ORAL_CAPSULE | ORAL | Status: DC | PRN

## 2014-09-03 MED ORDER — FLUOXETINE HCL 20 MG PO CAPS
20.0000 mg | ORAL_CAPSULE | Freq: Every day | ORAL | Status: DC
  Administered 2014-09-03 – 2014-09-05 (×3): 20 mg via ORAL
  Filled 2014-09-03 (×2): qty 1
  Filled 2014-09-03: qty 14
  Filled 2014-09-03 (×4): qty 1

## 2014-09-03 MED ORDER — VITAMIN B-1 100 MG PO TABS: 100.0000 mg | ORAL_TABLET | Freq: Every day | ORAL | Status: DC

## 2014-09-03 MED ORDER — HYDROXYZINE HCL 25 MG PO TABS
25.0000 mg | ORAL_TABLET | Freq: Four times a day (QID) | ORAL | Status: DC | PRN
  Administered 2014-09-03: 25 mg via ORAL
  Filled 2014-09-03: qty 1

## 2014-09-03 MED ORDER — NICOTINE 21 MG/24HR TD PT24
21.0000 mg | MEDICATED_PATCH | Freq: Every day | TRANSDERMAL | Status: DC
  Administered 2014-09-03 – 2014-09-05 (×3): 21 mg via TRANSDERMAL
  Filled 2014-09-03 (×4): qty 1

## 2014-09-03 MED ORDER — LACTATED RINGERS IV BOLUS (SEPSIS)
1000.0000 mL | Freq: Once | INTRAVENOUS | Status: AC
  Administered 2014-09-03: 1000 mL via INTRAVENOUS

## 2014-09-03 MED ORDER — HYDROXYZINE HCL 50 MG PO TABS
50.0000 mg | ORAL_TABLET | Freq: Three times a day (TID) | ORAL | Status: DC | PRN
  Filled 2014-09-03: qty 20

## 2014-09-03 MED ORDER — TRAZODONE HCL 50 MG PO TABS
50.0000 mg | ORAL_TABLET | Freq: Every evening | ORAL | Status: DC | PRN
  Administered 2014-09-03 – 2014-09-04 (×2): 50 mg via ORAL
  Filled 2014-09-03: qty 1
  Filled 2014-09-03: qty 14
  Filled 2014-09-03: qty 1

## 2014-09-03 MED ORDER — CHLORDIAZEPOXIDE HCL 25 MG PO CAPS: 25.0000 mg | ORAL_CAPSULE | Freq: Every day | ORAL | Status: DC

## 2014-09-03 MED ORDER — ACETAMINOPHEN 325 MG PO TABS: 650.0000 mg | ORAL_TABLET | Freq: Four times a day (QID) | ORAL | Status: DC | PRN

## 2014-09-03 MED ORDER — THIAMINE HCL 100 MG/ML IJ SOLN
100.0000 mg | Freq: Once | INTRAMUSCULAR | Status: AC
  Administered 2014-09-03: 100 mg via INTRAMUSCULAR
  Filled 2014-09-03: qty 2

## 2014-09-03 NOTE — ED Notes (Signed)
Pt ambulatory w/o difficulty to [facility] w/ [place], belongings given to driver.
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
## 2014-09-03 NOTE — ED Notes (Signed)
tts into see 

## 2014-09-03 NOTE — ED Notes (Signed)
PO fluids encouraged, snack given

## 2014-09-03 NOTE — ED Notes (Signed)
Pt's mother took belongings home with her when she and the pt's sister left at 7 hrs.

## 2014-09-03 NOTE — ED Notes (Signed)
Pt's mother into see 

## 2014-09-03 NOTE — Progress Notes (Deleted)
MD completed DC order and DC SRA in pt's chart, after talking to patient and pt overheard saying " just send me home them..I'm not suicidal and at least I can get my drugs at home ". Pt completed her morning self inventory and on it she wrote she denies SI within the past 24 hrs and she rated her depression, hopelessness and anxiety "4/0/5", respectively. While this [facility], NP ( [facility]) completed DC education with pt. Per this NP, pt given DC AVS, f/u instructions regarding her meds, suicide care and community resource info was included in her DC teaching. NP stated all belongings were returned to pt and pt was escorted out bldg entrance. Marland Kitchen

## 2014-09-03 NOTE — Progress Notes (Signed)
D.  Pt anxious on approach, but sitting in room speaking appropriately with roommate.  Pt was positive for evening AA group.  Pt's husband left her some clothes and belongings as well as made sure we have his name and phone number as emergency contact.  Husband seemed appropriately concerned.  Pt denies SI/HI/hallucinations at this time.  Requested night time medication, tremulous, stated "I have been shakey for years".  Concerned about not being able to receive her xanax.  Pt states that she does not abuse her medication.   A.  Support and encouragement offered, medication given as ordered  R.  Pt remains safe on unit, will continue to monitor.
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
## 2014-09-03 NOTE — Progress Notes (Signed)
[facility] Group Notes:  (Nursing/MHT/Case Management/Adjunct)  Date:  09/03/2014  Time:  11:29 PM  Type of Therapy:  Psychoeducational Skills  Participation Level:  Minimal  Participation Quality:  Attentive  Affect:  Depressed  Cognitive:  Appropriate  Insight:  Appropriate  Engagement in Group:  Limited  Modes of Intervention:  Education  Summary of Progress/Problems: The patient attended the A. A. Meeting and was appropriate.   Archie Balboa S 09/03/2014, 11:29 PM

## 2014-09-03 NOTE — ED Provider Notes (Signed)
0700 - Patient care from Dr. Kathrynn Humble. Awaiting Psych consult. Here s/p Xanax overuse, no SI/HI. Does want EtOH detox also.  9:55 AM  Psych informed me patient qualifies as an admission and will likely be transferred over to [facility] later today.  Evelina Bucy, MD 09/03/14 [phone]
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
## 2014-09-03 NOTE — ED Notes (Signed)
Bed: RESA Expected date:  Expected time:  Means of arrival:  Comments: EMS/O.D. 
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
## 2014-09-03 NOTE — ED Notes (Signed)
Family at bedside. 

## 2014-09-03 NOTE — ED Notes (Signed)
[facility] contacted for transport 

## 2014-09-04 ENCOUNTER — Encounter (HOSPITAL_COMMUNITY): Payer: Self-pay | Admitting: Psychiatry

## 2014-09-04 DIAGNOSIS — F4311 Post-traumatic stress disorder, acute: Secondary | ICD-10-CM | POA: Diagnosis present

## 2014-09-04 DIAGNOSIS — F1994 Other psychoactive substance use, unspecified with psychoactive substance-induced mood disorder: Secondary | ICD-10-CM

## 2014-09-04 DIAGNOSIS — F439 Reaction to severe stress, unspecified: Secondary | ICD-10-CM

## 2014-09-04 DIAGNOSIS — F331 Major depressive disorder, recurrent, moderate: Secondary | ICD-10-CM

## 2014-09-04 DIAGNOSIS — F1099 Alcohol use, unspecified with unspecified alcohol-induced disorder: Secondary | ICD-10-CM

## 2014-09-04 NOTE — [facility] Group Notes (Signed)
[facility] LCSW Group Therapy  09/04/2014 2:03 PM  Type of Therapy:  Group Therapy  Participation Level:  Active  Participation Quality:  Appropriate, Sharing and Supportive  Affect:  Appropriate  Cognitive:  Appropriate and Oriented  Insight:  Developing/Improving, Engaged and Supportive  Engagement in Therapy:  Developing/Improving, Engaged and Supportive  Modes of Intervention:  Discussion, Education, Exploration, Rapport Building and Support  Summary of Progress/Problems: Pt was engaged and listened to others share about self-sabotaging behaviors and supports.  Pt shared a personal example of self-sabotaging behaviors, as well as effective coping skills.   Katha Hamming 09/04/2014, 2:03 PM

## 2014-09-04 NOTE — [facility] Group Notes (Signed)
[facility] Group Notes:  (Nursing/MHT/Case Management/Adjunct)  Date:  09/04/2014  Time:  1030am  Type of Therapy:  Life Skills Groups  Participation Level:  Active  Participation Quality:  Appropriate and Attentive  Affect:  Blunted  Cognitive:  Alert and Appropriate  Insight:  Appropriate and Good  Engagement in Group:  Engaged  Modes of Intervention:  Discussion, Education and Support  Summary of Progress/Problems: Patient was engaged in group and responded appropriately. Pt rates her energy level 5/10 and states love means "fulfillment" to her.  Charlyne Quale A 09/04/2014, 2:35 PM

## 2014-09-04 NOTE — Progress Notes (Signed)
Lindsay Griffin is doing much better today. SHe has attended all of her scheduled groups.   A She completed her morning assessment and on it she wrote  She denied SI within the past 24 hrs and she rated her depression, hopelessness and anxiety 0 /0 0 /0 "respectively.   R Safety is in place.
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
## 2014-09-04 NOTE — [facility] Suicide Risk Assessment (Signed)
Suicide Risk Assessment  Admission Assessment     Nursing information obtained from:  Patient Demographic factors:  Caucasian Current Mental Status:  NA Loss Factors:  NA Historical Factors:  Impulsivity Risk Reduction Factors:  Responsible for children under 37 years of age, Sense of responsibility to family, Living with another person, especially a relative, Positive social support Total Time spent with patient: 45 minutes  CLINICAL FACTORS:   Depression:   Comorbid alcohol abuse/dependence Impulsivity Alcohol/Substance Abuse/Dependencies  PCOGNITIVE FEATURES THAT CONTRIBUTE TO RISK:  Polarized thinking Thought constriction (tunnel vision)    SUICIDE RISK:   Moderate:  PLAN OF CARE: Supportive approach/coping skills/relapse prevention                               Detox with Librium                                Reassess and address the mood/anxiety disorder  I certify that inpatient services furnished can reasonably be expected to improve the patient's condition.  Alixander Rallis A 09/04/2014, 4:48 PM

## 2014-09-04 NOTE — [facility] Group Notes (Signed)
[facility] Group Notes:  (Nursing/MHT/Case Management/Adjunct)  Date:  09/04/2014  Time:  1515pm  Type of Therapy:  Therapeutic Ball Activity  Participation Level:  Active  Participation Quality:  Appropriate and Attentive  Affect:  Appropriate  Cognitive:  Alert and Appropriate  Insight:  Appropriate and Good  Engagement in Group:  Engaged  Modes of Intervention:  Activity  Summary of Progress/Problems: Patient attended group and participated in activity and answered activity questions appropriately.  Charlyne Quale A 09/04/2014, 3:41 PM

## 2014-09-04 NOTE — Progress Notes (Signed)
Patient did attend the evening speaker AA meeting.  

## 2014-09-05 DIAGNOSIS — F1020 Alcohol dependence, uncomplicated: Secondary | ICD-10-CM

## 2014-09-05 DIAGNOSIS — F322 Major depressive disorder, single episode, severe without psychotic features: Secondary | ICD-10-CM | POA: Insufficient documentation

## 2014-09-05 DIAGNOSIS — F332 Major depressive disorder, recurrent severe without psychotic features: Secondary | ICD-10-CM

## 2014-09-05 MED ORDER — HYDROXYZINE HCL 50 MG PO TABS: 50.0000 mg | ORAL_TABLET | Freq: Three times a day (TID) | ORAL | Status: DC | PRN

## 2014-09-05 MED ORDER — FLUOXETINE HCL 20 MG PO CAPS: 20.0000 mg | ORAL_CAPSULE | Freq: Every day | ORAL | Status: DC

## 2014-09-05 MED ORDER — TRAZODONE HCL 50 MG PO TABS: 50.0000 mg | ORAL_TABLET | Freq: Every evening | ORAL | Status: DC | PRN

## 2014-09-05 NOTE — Progress Notes (Signed)
D. Pt has bee up and has been active in milieu this evening, did attend and participate in evening group session. Pt appears anxious in the milieu and spoke of the events leading up to her hospitalization and the difficulties she is having dealing with the events that occurred. Pt does not appear to have any signs or symptoms of withdrawal and spoke of hopeful discharge for tomorrow. Pt did receive medications without incident. A. Support and encouragement provided. R. Safety maintained, will continue to monitor.

## 2014-09-05 NOTE — Progress Notes (Signed)
Discharge note: Pt received both written and verbal discharge instructions. Pt verbalized understanding of discharge instructions. Pt agreed to f/u appt and med regimen. Pt denies SI/HI/AVH/DEP. Pt received sample meds, prescriptions and all belongings. Pt safely left [facility] with her husband.
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
## 2014-09-05 NOTE — Progress Notes (Addendum)
[facility] Adult Case Management Discharge Plan :  Will you be returning to the same living situation after discharge: Yes,  patient will return home with her husband At discharge, do you have transportation home?:Yes,  patient reports that her husband will provide transportation Do you have the ability to pay for your medications:Yes,  patient will be provided with prescriptions at discharge  Release of information consent forms completed and in the chart;  Patient's signature needed at discharge.  Patient to Follow up at: Follow-up Information    Follow up with [facility] On 09/06/2014.   Specialty:  Professional Counselor   Why:  Please follow up with your counselor Theodoro Kos regarding therapy appointment scheduled for 09/06/14.   Contact information:   [facility] [place] [id number] [phone]       Patient denies SI/HI:   Yes,  denies    Safety Planning and Suicide Prevention discussed:  Yes,  with patient and husband   Richardo Priest 09/05/2014, 12:41 PM

## 2014-09-05 NOTE — BHH Suicide Risk Assessment (Signed)
[facility] INPATIENT:  Family/Significant Other Suicide Prevention Education  Suicide Prevention Education:  Education Completed; husband Kisa Fujii [phone],  (name of family member/significant other) has been identified by the patient as the family member/significant other with whom the patient will be residing, and identified as the person(s) who will aid the patient in the event of a mental health crisis (suicidal ideations/suicide attempt).  With written consent from the patient, the family member/significant other has been provided the following suicide prevention education, prior to the and/or following the discharge of the patient.  The suicide prevention education provided includes the following:  Suicide risk factors  Suicide prevention and interventions  National Suicide Hotline telephone number  [facility] assessment telephone number  [facility] Emergency Assistance [place] and/or Residential Mobile Crisis Unit telephone number  Request made of family/significant other to:  Remove weapons (e.g., guns, rifles, knives), all items previously/currently identified as safety concern.    Remove drugs/medications (over-the-counter, prescriptions, illicit drugs), all items previously/currently identified as a safety concern.  The family member/significant other verbalizes understanding of the suicide prevention education information provided.  The family member/significant other agrees to remove the items of safety concern listed above.  Lindsay Griffin, Casimiro Needle 09/05/2014, 12:54 PM

## 2014-09-07 NOTE — Progress Notes (Signed)
Patient Discharge Instructions:  After Visit Summary (AVS):   Faxed to:  09/07/14 Discharge Summary Note:   Faxed to:  09/07/14 Psychiatric Admission Assessment Note:   Faxed to:  09/07/14 Suicide Risk Assessment - Discharge Assessment:   Faxed to:  09/07/14 Faxed/Sent to the Next [facility] Care provider:  09/07/14 Faxed to [facility] of the [facility] [place], 09/07/2014, 2:51 PM
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
## 2015-11-22 DIAGNOSIS — J301 Allergic rhinitis due to pollen: Secondary | ICD-10-CM | POA: Insufficient documentation

## 2015-12-25 DIAGNOSIS — N644 Mastodynia: Secondary | ICD-10-CM | POA: Diagnosis not present

## 2016-02-20 DIAGNOSIS — M25531 Pain in right wrist: Secondary | ICD-10-CM | POA: Diagnosis not present

## 2016-04-15 DIAGNOSIS — Z6822 Body mass index (BMI) 22.0-22.9, adult: Secondary | ICD-10-CM | POA: Diagnosis not present

## 2016-04-15 DIAGNOSIS — Z01419 Encounter for gynecological examination (general) (routine) without abnormal findings: Secondary | ICD-10-CM | POA: Diagnosis not present

## 2016-04-15 DIAGNOSIS — Z1151 Encounter for screening for human papillomavirus (HPV): Secondary | ICD-10-CM | POA: Diagnosis not present

## 2016-05-01 DIAGNOSIS — M2550 Pain in unspecified joint: Secondary | ICD-10-CM | POA: Diagnosis not present

## 2016-05-01 DIAGNOSIS — M7989 Other specified soft tissue disorders: Secondary | ICD-10-CM | POA: Diagnosis not present

## 2016-05-01 DIAGNOSIS — L409 Psoriasis, unspecified: Secondary | ICD-10-CM | POA: Diagnosis not present

## 2016-07-02 DIAGNOSIS — M7989 Other specified soft tissue disorders: Secondary | ICD-10-CM | POA: Diagnosis not present

## 2016-07-02 DIAGNOSIS — M2550 Pain in unspecified joint: Secondary | ICD-10-CM | POA: Diagnosis not present

## 2016-07-02 DIAGNOSIS — L409 Psoriasis, unspecified: Secondary | ICD-10-CM | POA: Diagnosis not present

## 2016-08-29 DIAGNOSIS — L400 Psoriasis vulgaris: Secondary | ICD-10-CM | POA: Diagnosis not present

## 2016-08-29 DIAGNOSIS — L918 Other hypertrophic disorders of the skin: Secondary | ICD-10-CM | POA: Diagnosis not present

## 2016-10-29 DIAGNOSIS — L409 Psoriasis, unspecified: Secondary | ICD-10-CM | POA: Diagnosis not present

## 2016-10-29 DIAGNOSIS — L4050 Arthropathic psoriasis, unspecified: Secondary | ICD-10-CM | POA: Diagnosis not present

## 2016-10-29 DIAGNOSIS — M2550 Pain in unspecified joint: Secondary | ICD-10-CM | POA: Diagnosis not present

## 2016-12-05 DIAGNOSIS — Z0100 Encounter for examination of eyes and vision without abnormal findings: Secondary | ICD-10-CM | POA: Diagnosis not present

## 2016-12-27 DIAGNOSIS — L409 Psoriasis, unspecified: Secondary | ICD-10-CM | POA: Diagnosis not present

## 2016-12-27 DIAGNOSIS — M25431 Effusion, right wrist: Secondary | ICD-10-CM | POA: Diagnosis not present

## 2016-12-27 DIAGNOSIS — L4050 Arthropathic psoriasis, unspecified: Secondary | ICD-10-CM | POA: Diagnosis not present

## 2016-12-27 DIAGNOSIS — M2550 Pain in unspecified joint: Secondary | ICD-10-CM | POA: Diagnosis not present

## 2017-04-01 DIAGNOSIS — M25431 Effusion, right wrist: Secondary | ICD-10-CM | POA: Diagnosis not present

## 2017-04-01 DIAGNOSIS — L409 Psoriasis, unspecified: Secondary | ICD-10-CM | POA: Diagnosis not present

## 2017-04-01 DIAGNOSIS — L4050 Arthropathic psoriasis, unspecified: Secondary | ICD-10-CM | POA: Diagnosis not present

## 2017-04-01 DIAGNOSIS — M2550 Pain in unspecified joint: Secondary | ICD-10-CM | POA: Diagnosis not present

## 2017-04-23 DIAGNOSIS — Z01419 Encounter for gynecological examination (general) (routine) without abnormal findings: Secondary | ICD-10-CM | POA: Diagnosis not present

## 2017-04-23 DIAGNOSIS — Z1231 Encounter for screening mammogram for malignant neoplasm of breast: Secondary | ICD-10-CM | POA: Diagnosis not present

## 2017-04-23 DIAGNOSIS — Z6822 Body mass index (BMI) 22.0-22.9, adult: Secondary | ICD-10-CM | POA: Diagnosis not present

## 2017-04-24 ENCOUNTER — Other Ambulatory Visit: Payer: Self-pay | Admitting: Obstetrics and Gynecology

## 2017-04-24 DIAGNOSIS — N63 Unspecified lump in unspecified breast: Secondary | ICD-10-CM

## 2017-04-24 DIAGNOSIS — R928 Other abnormal and inconclusive findings on diagnostic imaging of breast: Secondary | ICD-10-CM

## 2017-04-25 ENCOUNTER — Ambulatory Visit
Admission: RE | Admit: 2017-04-25 | Discharge: 2017-04-25 | Disposition: A | Discharge status: Home / Self Care | Payer: BLUE CROSS/BLUE SHIELD | Source: Ambulatory Visit | Attending: Obstetrics and Gynecology | Admitting: Obstetrics and Gynecology

## 2017-04-25 ENCOUNTER — Other Ambulatory Visit: Payer: Self-pay | Admitting: Obstetrics and Gynecology

## 2017-04-25 DIAGNOSIS — R922 Inconclusive mammogram: Secondary | ICD-10-CM | POA: Diagnosis not present

## 2017-04-25 DIAGNOSIS — N6322 Unspecified lump in the left breast, upper inner quadrant: Secondary | ICD-10-CM | POA: Diagnosis not present

## 2017-04-25 DIAGNOSIS — R928 Other abnormal and inconclusive findings on diagnostic imaging of breast: Secondary | ICD-10-CM

## 2017-04-25 DIAGNOSIS — N6320 Unspecified lump in the left breast, unspecified quadrant: Secondary | ICD-10-CM

## 2017-04-25 DIAGNOSIS — N63 Unspecified lump in unspecified breast: Secondary | ICD-10-CM

## 2017-04-28 ENCOUNTER — Ambulatory Visit
Admission: RE | Admit: 2017-04-28 | Discharge: 2017-04-28 | Disposition: A | Discharge status: Home / Self Care | Payer: BLUE CROSS/BLUE SHIELD | Source: Ambulatory Visit | Attending: Obstetrics and Gynecology | Admitting: Obstetrics and Gynecology

## 2017-04-28 ENCOUNTER — Other Ambulatory Visit: Payer: Self-pay | Admitting: Obstetrics and Gynecology

## 2017-04-28 DIAGNOSIS — N6320 Unspecified lump in the left breast, unspecified quadrant: Secondary | ICD-10-CM

## 2017-04-28 DIAGNOSIS — R921 Mammographic calcification found on diagnostic imaging of breast: Secondary | ICD-10-CM | POA: Diagnosis not present

## 2017-04-28 DIAGNOSIS — D242 Benign neoplasm of left breast: Secondary | ICD-10-CM | POA: Diagnosis not present

## 2017-04-28 HISTORY — PX: BREAST BIOPSY: SHX20

## 2017-07-31 ENCOUNTER — Encounter: Payer: Self-pay | Admitting: Gastroenterology

## 2017-07-31 DIAGNOSIS — L4050 Arthropathic psoriasis, unspecified: Secondary | ICD-10-CM | POA: Diagnosis not present

## 2017-07-31 DIAGNOSIS — Z720 Tobacco use: Secondary | ICD-10-CM | POA: Diagnosis not present

## 2017-07-31 DIAGNOSIS — M25431 Effusion, right wrist: Secondary | ICD-10-CM | POA: Diagnosis not present

## 2017-07-31 DIAGNOSIS — M2550 Pain in unspecified joint: Secondary | ICD-10-CM | POA: Diagnosis not present

## 2017-08-12 ENCOUNTER — Encounter: Payer: Self-pay | Admitting: Gastroenterology

## 2017-08-12 ENCOUNTER — Ambulatory Visit: Payer: BLUE CROSS/BLUE SHIELD | Admitting: Gastroenterology

## 2017-08-12 VITALS — BP 120/84 | HR 76 | Ht 62.0 in | Wt 125.5 lb

## 2017-08-12 DIAGNOSIS — K5900 Constipation, unspecified: Secondary | ICD-10-CM

## 2017-08-12 DIAGNOSIS — K602 Anal fissure, unspecified: Secondary | ICD-10-CM

## 2017-08-12 DIAGNOSIS — K625 Hemorrhage of anus and rectum: Secondary | ICD-10-CM

## 2017-08-12 DIAGNOSIS — K644 Residual hemorrhoidal skin tags: Secondary | ICD-10-CM

## 2017-08-12 MED ORDER — NA SULFATE-K SULFATE-MG SULF 17.5-3.13-1.6 GM/177ML PO SOLN: ORAL | 0 refills | Status: DC

## 2017-08-12 MED ORDER — HYDROCORTISONE 2.5 % RE CREA: 1.0000 "application " | TOPICAL_CREAM | Freq: Two times a day (BID) | RECTAL | 2 refills | Status: DC

## 2017-08-12 MED ORDER — AMBULATORY NON FORMULARY MEDICATION: 2 refills | Status: DC

## 2017-08-12 NOTE — Patient Instructions (Addendum)
You have been scheduled for a colonoscopy. Please follow written instructions given to you at your visit today.  Please pick up your prep supplies at the pharmacy within the next 1-3 days. If you use inhalers (even only as needed), please bring them with you on the day of your procedure.     Please start Miralax - purchase over the counter.  Take 17 grams mixed in water daily Please start Benefiber/Citrucel - pick one of your choice and follow package instructions for daily use   If you are age 40 or older, your body mass index should be between 23-30. Your Body mass index is 22.95 kg/m. If this is out of the aforementioned range listed, please consider follow up with your Primary Care Provider.  If you are age 33 or younger, your body mass index should be between 19-25. Your Body mass index is 22.95 kg/m. If this is out of the aformentioned range listed, please consider follow up with your Primary Care Provider.

## 2017-08-12 NOTE — Progress Notes (Signed)
08/12/2017 Lindsay Griffin [id number] Feb 09, 1976   HISTORY OF PRESENT ILLNESS: This is a very pleasant 41 year old female who is new to our office.  She is referred here by her gynecologist, Dr. Ronita Hipps, for evaluation regarding severe/worsening constipation and rectal bleeding.  She tells me that she has suffered from lifelong constipation at least since she was a teenager.  She has had hemorrhoids since she was 41 years old that have just worsened with pregnancies, etc.  She says that her constipation seems to be worsening to the point that she is having very painful bowel movements and actually recently just started having some rectal bleeding.  Says that sometimes she has to push on her perineum to help her pass her stool.  The bleeding is described as bright red blood, but sometimes it occurs for a month at a time.  She had never seen blood in the past until just recently.  She says is very hard to keep her bottom and perianal area clean due to her hemorrhoids.    Past Medical History:  Diagnosis Date  . Abnormal Pap smear   . Acute upper respiratory infections of unspecified site   . AMA (advanced maternal age) multigravida 63+   . Anemia   . Anxiety   . Arthritis   . External hemorrhoids without mention of complication   . H/O varicella   . Mastodynia   . Other specified visual disturbances    with pregnancy  . Retention of urine, unspecified   . Rubella without mention of complication    unsure if measles or allergic reaction to sulfa   Past Surgical History:  Procedure Laterality Date  . CRYOTHERAPY    . FOOT SURGERY     R x2    reports that she quit smoking about 5 years ago. Her smoking use included cigarettes. She smoked 1.00 pack per day. she has never used smokeless tobacco. She reports that she drinks about 3.6 - 4.8 oz of alcohol per week. She reports that she does not use drugs. family history includes Cancer in her maternal grandfather and mother; Diabetes in  her mother and paternal grandfather; Heart disease in her maternal grandmother; Hypertension in her mother; Pancreatitis in her father; Stroke in her maternal grandmother. Allergies  Allergen Reactions  . Sulfa Antibiotics Rash    As a child      Outpatient Encounter Medications as of 08/12/2017  Medication Sig  . fluticasone (FLONASE) 50 MCG/ACT nasal spray Place into both nostrils daily.  Marland Kitchen ibuprofen (ADVIL,MOTRIN) 200 MG tablet Take 200 mg by mouth every 6 (six) hours as needed.  . Ixekizumab (TALTZ) 80 MG/ML SOAJ Inject into the skin every 30 (thirty) days.  Marland Kitchen loratadine (CLARITIN) 10 MG tablet Take 10 mg by mouth daily.  . AMBULATORY NON FORMULARY MEDICATION Medication Name: Nitro 0.125 %  Place a pea size amount into rectum to the first knuckle 2 times a day  . hydrocortisone (ANUSOL-HC) 2.5 % rectal cream Place 1 application rectally 2 (two) times daily.  . Na Sulfate-K Sulfate-Mg Sulf 17.5-3.13-1.6 GM/177ML SOLN Take per colonoscopy prep instructions  . [DISCONTINUED] FLUoxetine (PROZAC) 20 MG capsule Take 1 capsule (20 mg total) by mouth daily. For depression  . [DISCONTINUED] hydrOXYzine (ATARAX/VISTARIL) 50 MG tablet Take 1 tablet (50 mg total) by mouth 3 (three) times daily as needed for anxiety.  . [DISCONTINUED] traZODone (DESYREL) 50 MG tablet Take 1 tablet (50 mg total) by mouth at bedtime as needed for sleep.  No facility-administered encounter medications on file as of 08/12/2017.      REVIEW OF SYSTEMS  : All other systems reviewed and negative except where noted in the History of Present Illness.   PHYSICAL EXAM: BP 120/84   Pulse 76   Ht 5\' 2"  (1.575 m)   Wt 125 lb 8 oz (56.9 kg)   BMI 22.95 kg/m  General: Well developed white female in no acute distress Head: Normocephalic and atraumatic Eyes:  Sclerae anicteric, conjunctiva pink. Ears: Normal auditory acuity Lungs: Clear throughout to auscultation; no increased WOB. Heart: Regular rate and rhythm; no  M/R/G Abdomen: Soft, non-distended.  BS present.  Non-tender. Rectal:  Large external hemorrhoids noted.  Also noted to have a posterior anal fissure.  DRE tolerated but very uncomfortable.  No masses noted with the rectum.  Anoscopy not performed. Musculoskeletal: Symmetrical with no gross deformities  Skin: No lesions on visible extremities Extremities: No edema  Neurological: Alert oriented x 4, grossly non-focal Psychological:  Alert and cooperative. Normal mood and affect  ASSESSMENT AND PLAN: *41 year old female with lifelong constipation and severe hemorrhoids.  Complains of painful bowel movements and now just recently some rectal bleeding.  Feels like constipation has worsened.  Has large external hemorrhoids and anal fissure.  We will treat the anal fissure with nitro gel 0.125% twice daily for 8-10 weeks.  Also discussed with Dr. care instructions including sits baths, avoidance of aggressive hygiene, etc.  We need to aggressively treat her constipation in order to prevent worsening/recurrence.  She will start Miralax daily as well as a daily powder fiber supplement such as Benefiber or Citrucel.  We are going to schedule for colonoscopy as well as I think that this should be performed, but we will wait a few weeks until her fissure has some time to heal.    **The risks, benefits, and alternatives to colonoscopy were discussed with the patient and she consents to proceed.    CC:  Dr. Ronita Hipps

## 2017-08-13 DIAGNOSIS — K625 Hemorrhage of anus and rectum: Secondary | ICD-10-CM | POA: Insufficient documentation

## 2017-08-13 DIAGNOSIS — K602 Anal fissure, unspecified: Secondary | ICD-10-CM | POA: Insufficient documentation

## 2017-08-13 DIAGNOSIS — K644 Residual hemorrhoidal skin tags: Secondary | ICD-10-CM | POA: Insufficient documentation

## 2017-08-13 DIAGNOSIS — K5904 Chronic idiopathic constipation: Secondary | ICD-10-CM | POA: Insufficient documentation

## 2017-08-13 DIAGNOSIS — K5900 Constipation, unspecified: Secondary | ICD-10-CM | POA: Insufficient documentation

## 2017-08-13 HISTORY — DX: Hemorrhage of anus and rectum: K62.5

## 2017-08-13 HISTORY — DX: Anal fissure, unspecified: K60.2

## 2017-08-14 NOTE — Progress Notes (Signed)
Reviewed and agree with documentation and assessment and plan. K. Veena Geordie Nooney , MD   

## 2017-08-14 NOTE — Progress Notes (Signed)
Reviewed and agree with documentation and assessment and plan. K. Veena Montae Stager , MD   

## 2017-09-11 ENCOUNTER — Telehealth: Payer: Self-pay | Admitting: Gastroenterology

## 2017-09-11 NOTE — Telephone Encounter (Signed)
Spoke with patient, Will give patient Plenvu sample kit reprinted instructions and pt will continue the Miralax 2 day prep along with the Plenvu

## 2017-09-12 ENCOUNTER — Encounter: Payer: Self-pay | Admitting: Gastroenterology

## 2017-09-16 HISTORY — PX: SEPTOPLASTY: SUR1290

## 2017-09-17 ENCOUNTER — Encounter: Payer: Self-pay | Admitting: Gastroenterology

## 2017-09-23 ENCOUNTER — Ambulatory Visit (AMBULATORY_SURGERY_CENTER): Payer: BLUE CROSS/BLUE SHIELD | Admitting: Gastroenterology

## 2017-09-23 ENCOUNTER — Other Ambulatory Visit: Payer: Self-pay

## 2017-09-23 ENCOUNTER — Encounter: Payer: Self-pay | Admitting: Gastroenterology

## 2017-09-23 VITALS — BP 135/81 | HR 77 | Temp 98.2°F | Resp 13 | Ht 62.0 in | Wt 125.0 lb

## 2017-09-23 DIAGNOSIS — K625 Hemorrhage of anus and rectum: Secondary | ICD-10-CM

## 2017-09-23 DIAGNOSIS — K921 Melena: Secondary | ICD-10-CM | POA: Diagnosis not present

## 2017-09-23 DIAGNOSIS — K635 Polyp of colon: Secondary | ICD-10-CM | POA: Diagnosis not present

## 2017-09-23 DIAGNOSIS — D125 Benign neoplasm of sigmoid colon: Secondary | ICD-10-CM

## 2017-09-23 MED ORDER — SODIUM CHLORIDE 0.9 % IV SOLN: 500.0000 mL | Freq: Once | INTRAVENOUS | Status: DC

## 2017-09-23 NOTE — Op Note (Signed)
[facility] Patient Name: Lindsay Griffin Procedure Date: 09/23/2017 1:59 PM MRN: [id number] Endoscopist: Mauri Pole , MD Age: 42 Referring MD:  Date of Birth: 1976-08-12 Gender: Female Account #: [id number] Procedure:                Colonoscopy Indications:              Evaluation of unexplained GI bleeding Medicines:                Monitored Anesthesia Care Procedure:                Pre-Anesthesia Assessment:                           - Prior to the procedure, a History and Physical                            was performed, and patient medications and                            allergies were reviewed. The patient's tolerance of                            previous anesthesia was also reviewed. The risks                            and benefits of the procedure and the sedation                            options and risks were discussed with the patient.                            All questions were answered, and informed consent                            was obtained. Prior Anticoagulants: The patient has                            taken no previous anticoagulant or antiplatelet                            agents. ASA Grade Assessment: II - A patient with                            mild systemic disease. After reviewing the risks                            and benefits, the patient was deemed in                            satisfactory condition to undergo the procedure.                           After obtaining informed consent, the colonoscope  was passed under direct vision. Throughout the                            procedure, the patient's blood pressure, pulse, and                            oxygen saturations were monitored continuously. The                            Model PCF-H190DL [id number]) scope was introduced                            through the anus and advanced to the the terminal                            ileum, with  identification of the appendiceal                            orifice and IC valve. The colonoscopy was performed                            without difficulty. The patient tolerated the                            procedure well. The quality of the bowel                            preparation was excellent. The ileocecal valve,                            appendiceal orifice, and rectum were photographed. Scope In: 2:06:14 PM Scope Out: 2:27:14 PM Scope Withdrawal Time: 0 hours 9 minutes 40 seconds  Total Procedure Duration: 0 hours 21 minutes 0 seconds  Findings:                 A 2 mm polyp was found in the sigmoid colon. The                            polyp was sessile. The polyp was removed with a                            cold biopsy forceps. Resection and retrieval were                            complete.                           External and internal hemorrhoids were found during                            retroflexion and during perianal exam. The                            hemorrhoids were large and Grade III (internal  hemorrhoids that prolapse but require manual                            reduction).                           The exam was otherwise without abnormality. Complications:            No immediate complications. Estimated Blood Loss:     Estimated blood loss was minimal. Impression:               - One 2 mm polyp in the sigmoid colon, removed with                            a cold biopsy forceps. Resected and retrieved.                           - External and internal hemorrhoids.                           - The examination was otherwise normal. Recommendation:           - Patient has a contact number available for                            emergencies. The signs and symptoms of potential                            delayed complications were discussed with the                            patient. Return to normal activities tomorrow.                             Written discharge instructions were provided to the                            patient.                           - Resume previous diet.                           - Continue present medications.                           - Await pathology results.                           - Repeat colonoscopy in 5-10 years for surveillance                            based on pathology results.                           - Refer to a colo-rectal surgeon at the next  available appointment. Mauri Pole, MD 09/23/2017 2:32:38 PM This report has been signed electronically.

## 2017-09-23 NOTE — Progress Notes (Signed)
Spontaneous respirations throughout. VSS. Resting comfortably. To PACU on room air. Report to  RN. 

## 2017-09-23 NOTE — Progress Notes (Signed)
Called to room to assist during endoscopic procedure.  Patient ID and intended procedure confirmed with present staff. Received instructions for my participation in the procedure from the performing physician.  

## 2017-09-23 NOTE — Patient Instructions (Signed)
YOU HAD AN ENDOSCOPIC PROCEDURE TODAY AT [facility]:   Refer to the procedure report that was given to you for any specific questions about what was found during the examination.  If the procedure report does not answer your questions, please call your gastroenterologist to clarify.  If you requested that your care partner not be given the details of your procedure findings, then the procedure report has been included in a sealed envelope for you to review at your convenience later.  YOU SHOULD EXPECT: Some feelings of bloating in the abdomen. Passage of more gas than usual.  Walking can help get rid of the air that was put into your GI tract during the procedure and reduce the bloating. If you had a lower endoscopy (such as a colonoscopy or flexible sigmoidoscopy) you may notice spotting of blood in your stool or on the toilet paper. If you underwent a bowel prep for your procedure, you may not have a normal bowel movement for a few days.  Please Note:  You might notice some irritation and congestion in your nose or some drainage.  This is from the oxygen used during your procedure.  There is no need for concern and it should clear up in a day or so.  SYMPTOMS TO REPORT IMMEDIATELY:   Following lower endoscopy (colonoscopy or flexible sigmoidoscopy):  Excessive amounts of blood in the stool  Significant tenderness or worsening of abdominal pains  Swelling of the abdomen that is new, acute  Fever of 100F or higher   For urgent or emergent issues, a gastroenterologist can be reached at any hour by calling [phone].   DIET:  We do recommend a small meal at first, but then you may proceed to your regular diet.  Drink plenty of fluids but you should avoid alcoholic beverages for 24 hours.  ACTIVITY:  You should plan to take it easy for the rest of today and you should NOT DRIVE or use heavy machinery until tomorrow (because of the sedation medicines used during the test).     FOLLOW UP: Our staff will call the number listed on your records the next business day following your procedure to check on you and address any questions or concerns that you may have regarding the information given to you following your procedure. If we do not reach you, we will leave a message.  However, if you are feeling well and you are not experiencing any problems, there is no need to return our call.  We will assume that you have returned to your regular daily activities without incident.  If any biopsies were taken you will be contacted by phone or by letter within the next 1-3 weeks.  Please call us at [phone] if you have not heard about the biopsies in 3 weeks.    SIGNATURES/CONFIDENTIALITY: You and/or your care partner have signed paperwork which will be entered into your electronic medical record.  These signatures attest to the fact that that the information above on your After Visit Summary has been reviewed and is understood.  Full responsibility of the confidentiality of this discharge information lies with you and/or your care-partner.  Polyp, hemorrhoid and hemorrhoid banding information given.  Hemorrhoid banding scheduled for 10/09/17 at 1515.

## 2017-09-24 ENCOUNTER — Telehealth: Payer: Self-pay | Admitting: *Deleted

## 2017-09-24 ENCOUNTER — Telehealth: Payer: Self-pay | Admitting: Gastroenterology

## 2017-09-24 ENCOUNTER — Other Ambulatory Visit: Payer: Self-pay

## 2017-09-24 MED ORDER — HYDROCORTISONE ACETATE 25 MG RE SUPP: 25.0000 mg | Freq: Two times a day (BID) | RECTAL | 0 refills | Status: DC

## 2017-09-24 NOTE — Telephone Encounter (Signed)
  Follow up Call-  Call back number 09/23/2017  Post procedure Call Back phone  # [phone]  Permission to leave phone message Yes  Some recent data might be hidden     Patient questions:  Do you have a fever, pain , or abdominal swelling? Yes.   Pain Score  8 *  Have you tolerated food without any problems? Yes.    Have you been able to return to your normal activities? No.  Do you have any questions about your discharge instructions: Diet   No. Medications  No. Follow up visit  No.  Do you have questions or concerns about your Care? No.  Actions: *Pain 8/10 with hemorrhoids.  Pt states that she has tried everything she has but has had pain with hemorrhoids all night to the point that she cannot sleep.  She tried a warm bath, preperation H, and took ibuprofren as well.  She had nitroglycerin cream left over from an anal fissure and tried that as well.  None of this has given her relief.  Please advise.

## 2017-09-24 NOTE — Telephone Encounter (Signed)
Please send Rx for Anusol suppositories per rectum twice daily X 7 days. Send urgent referral to colorectal surgery for prolapsed symptomatic hemorrhoids. Thanks

## 2017-09-24 NOTE — Telephone Encounter (Signed)
Patient notified of the referral and explained the reason. Rx to [facility] in [place]. [facility] Clarise Cruz, RN notified of the referral appointment. She says she will take care of this.

## 2017-09-24 NOTE — Telephone Encounter (Signed)
Insurance will not cover rectal suppositories , patient, per pharmacist did pick up Prep H suppositories and some recta care over the counter..  Will call patient to inform her that it is ok that she take the OTC meds...Marland KitchenMarland KitchenMarland Kitchen    Dr Darleen Crocker

## 2017-09-25 ENCOUNTER — Telehealth: Payer: Self-pay | Admitting: Gastroenterology

## 2017-09-25 NOTE — Telephone Encounter (Signed)
I have left a message for the triage at surgeon's office. No openings today. Do you want an extender to see her tomorrow? There is one opening at 11:00 on Lindsay Griffin's schedule. The patient has a $75 co-pay. Will Lindsay be able to do something

## 2017-09-25 NOTE — Telephone Encounter (Signed)
Patient's husband is here at the office. Asking if there is anything else that can be done for the rectal pain the patient is experiencing. He states she is "in excruciating pain" and "she thought about going to the ER last night." Her appointment with Dr Leighton Ruff is 9/50/72 for hemorrhoids. Discussed seeking care from an urgent care or her PCP for possible thrombosed hemorrhoids. He is "pretty sure" the Recticare is not helping any. He does ask if there is anything else she can be prescribed for the pain. Encouraged him to take her to urgent care or PCP to evaluate the cause of this pain.

## 2017-09-25 NOTE — Telephone Encounter (Signed)
Called patient, appears that she has prolapsed hemorrhoids, worse than before, half is hanging out. Advised her to try to reduce the prolapsed hemorrhoids if she can. Continue preparation H suppositories and sitz bath. If worse will need to go to ER for management. Can we try to bring her in office visit this afternoon and also see if she can be seen by [facility] sooner? Thanks

## 2017-09-25 NOTE — Telephone Encounter (Signed)
Lindsay Griffin surgery is able to move her appointment to 09/29/17 arrive at 8:40 am. The patient is notified and agrees to this new appointment.

## 2017-09-26 DIAGNOSIS — K6289 Other specified diseases of anus and rectum: Secondary | ICD-10-CM | POA: Diagnosis not present

## 2017-09-26 DIAGNOSIS — K649 Unspecified hemorrhoids: Secondary | ICD-10-CM | POA: Diagnosis not present

## 2017-09-29 DIAGNOSIS — K645 Perianal venous thrombosis: Secondary | ICD-10-CM | POA: Diagnosis not present

## 2017-10-01 ENCOUNTER — Encounter: Payer: Self-pay | Admitting: Gastroenterology

## 2017-10-03 HISTORY — PX: COLONOSCOPY: SHX174

## 2017-10-09 ENCOUNTER — Encounter: Payer: Self-pay | Admitting: Gastroenterology

## 2017-10-09 ENCOUNTER — Ambulatory Visit (INDEPENDENT_AMBULATORY_CARE_PROVIDER_SITE_OTHER): Payer: BLUE CROSS/BLUE SHIELD | Admitting: Gastroenterology

## 2017-10-09 VITALS — BP 112/78 | HR 75 | Ht 62.0 in | Wt 122.0 lb

## 2017-10-09 DIAGNOSIS — K642 Third degree hemorrhoids: Secondary | ICD-10-CM | POA: Diagnosis not present

## 2017-10-09 NOTE — Progress Notes (Signed)
PROCEDURE NOTE: The patient presents with symptomatic grade III  hemorrhoids, requesting rubber band ligation of his/her hemorrhoidal disease.  All risks, benefits and alternative forms of therapy were described and informed consent was obtained.  In the Left Lateral Decubitus position anoscopic examination revealed grade III hemorrhoids in the posterior, left lateral and right anterior position(s).  The anorectum was pre-medicated with 0.125% Nitroglycerine and Recticare The decision was made to band the right posterior internal hemorrhoid, and the [place] was used to perform band ligation without complication.  Digital anorectal examination was then performed to assure proper positioning of the band, and to adjust the banded tissue as required.  The patient was discharged home without pain or other issues.  Dietary and behavioral recommendations were given and along with follow-up instructions.     The following adjunctive treatments were recommended: MiraLAX 1 capful daily at bedtime  The patient will return in 2-4 weeks for  follow-up and possible additional banding as required. No complications were encountered and the patient tolerated the procedure well.  Damaris Hippo , MD [phone] Mon-Fri 8a-5p [phone] after 5p, weekends, holidays

## 2017-10-09 NOTE — Patient Instructions (Signed)

## 2017-10-14 ENCOUNTER — Other Ambulatory Visit: Payer: Self-pay | Admitting: Obstetrics and Gynecology

## 2017-10-14 DIAGNOSIS — D242 Benign neoplasm of left breast: Secondary | ICD-10-CM

## 2017-10-21 ENCOUNTER — Ambulatory Visit
Admission: RE | Admit: 2017-10-21 | Discharge: 2017-10-21 | Disposition: A | Discharge status: Home / Self Care | Payer: BLUE CROSS/BLUE SHIELD | Source: Ambulatory Visit | Attending: Obstetrics and Gynecology | Admitting: Obstetrics and Gynecology

## 2017-10-21 DIAGNOSIS — D242 Benign neoplasm of left breast: Secondary | ICD-10-CM

## 2017-10-21 DIAGNOSIS — R921 Mammographic calcification found on diagnostic imaging of breast: Secondary | ICD-10-CM | POA: Diagnosis not present

## 2017-10-29 DIAGNOSIS — Z720 Tobacco use: Secondary | ICD-10-CM | POA: Diagnosis not present

## 2017-10-29 DIAGNOSIS — J343 Hypertrophy of nasal turbinates: Secondary | ICD-10-CM | POA: Diagnosis not present

## 2017-10-29 DIAGNOSIS — J342 Deviated nasal septum: Secondary | ICD-10-CM | POA: Diagnosis not present

## 2017-10-29 DIAGNOSIS — J310 Chronic rhinitis: Secondary | ICD-10-CM | POA: Diagnosis not present

## 2017-11-11 DIAGNOSIS — R198 Other specified symptoms and signs involving the digestive system and abdomen: Secondary | ICD-10-CM | POA: Diagnosis not present

## 2017-11-11 DIAGNOSIS — Z720 Tobacco use: Secondary | ICD-10-CM | POA: Diagnosis not present

## 2017-11-11 DIAGNOSIS — L409 Psoriasis, unspecified: Secondary | ICD-10-CM | POA: Diagnosis not present

## 2017-11-11 DIAGNOSIS — L4050 Arthropathic psoriasis, unspecified: Secondary | ICD-10-CM | POA: Diagnosis not present

## 2017-12-08 ENCOUNTER — Ambulatory Visit: Payer: BLUE CROSS/BLUE SHIELD | Admitting: Gastroenterology

## 2017-12-08 ENCOUNTER — Encounter: Payer: Self-pay | Admitting: Gastroenterology

## 2017-12-08 VITALS — BP 106/60 | HR 84 | Ht 61.5 in | Wt 124.2 lb

## 2017-12-08 DIAGNOSIS — K641 Second degree hemorrhoids: Secondary | ICD-10-CM | POA: Diagnosis not present

## 2017-12-08 NOTE — Progress Notes (Signed)
PROCEDURE NOTE: The patient presents with symptomatic grade II-III  hemorrhoids, requesting rubber band ligation of his/her hemorrhoidal disease.  All risks, benefits and alternative forms of therapy were described and informed consent was obtained.   The anorectum was pre-medicated with 0.125% and Recticare The decision was made to band the Left lateral internal hemorrhoid, and the [place] was used to perform band ligation without complication.  Digital anorectal examination was then performed to assure proper positioning of the band, and to adjust the banded tissue as required.  The patient was discharged home without pain or other issues.  Dietary and behavioral recommendations were given and along with follow-up instructions.     The following adjunctive treatments were recommended: Kegel's exercises  The patient will return in 2-4 weeks for  follow-up and possible additional banding as required. No complications were encountered and the patient tolerated the procedure well.  Damaris Hippo , MD [phone] Mon-Fri 8a-5p [phone] after 5p, weekends, holidays

## 2017-12-08 NOTE — Patient Instructions (Addendum)
HEMORRHOID BANDING PROCEDURE    FOLLOW-UP CARE   1. The procedure you have had should have been relatively painless since the banding of the area involved does not have nerve endings and there is no pain sensation.  The rubber band cuts off the blood supply to the hemorrhoid and the band may fall off as soon as 48 hours after the banding (the band may occasionally be seen in the toilet bowl following a bowel movement). You may notice a temporary feeling of fullness in the rectum which should respond adequately to plain Tylenol or Motrin.  2. Following the banding, avoid strenuous exercise that evening and resume full activity the next day.  A sitz bath (soaking in a warm tub) or bidet is soothing, and can be useful for cleansing the area after bowel movements.     3. To avoid constipation, take two tablespoons of natural wheat bran, natural oat bran, flax, Benefiber or any over the counter fiber supplement and increase your water intake to 7-8 glasses daily.    4. Unless you have been prescribed anorectal medication, do not put anything inside your rectum for two weeks: No suppositories, enemas, fingers, etc.  5. Occasionally, you may have more bleeding than usual after the banding procedure.  This is often from the untreated hemorrhoids rather than the treated one.  Don't be concerned if there is a tablespoon or so of blood.  If there is more blood than this, lie flat with your bottom higher than your head and apply an ice pack to the area. If the bleeding does not stop within a half an hour or if you feel faint, call our office at [phone] or go to the emergency room.  6. Problems are not common; however, if there is a substantial amount of bleeding, severe pain, chills, fever or difficulty passing urine (very rare) or other problems, you should call us at [phone] or report to the nearest emergency room.  7. Do not stay seated continuously for more than 2-3 hours for a day or two  after the procedure.  Tighten your buttock muscles 10-15 times every two hours and take 10-15 deep breaths every 1-2 hours.  Do not spend more than a few minutes on the toilet if you cannot empty your bowel; instead re-visit the toilet at a later time.   Kegel Exercises Kegel exercises help strengthen the muscles that support the rectum, vagina, small intestine, bladder, and uterus. Doing Kegel exercises can help:  Improve bladder and bowel control.  Improve sexual response.  Reduce problems and discomfort during pregnancy.  Kegel exercises involve squeezing your pelvic floor muscles, which are the same muscles you squeeze when you try to stop the flow of urine. The exercises can be done while sitting, standing, or lying down, but it is best to vary your position. Phase 1 exercises 1. Squeeze your pelvic floor muscles tight. You should feel a tight lift in your rectal area. If you are a female, you should also feel a tightness in your vaginal area. Keep your stomach, buttocks, and legs relaxed. 2. Hold the muscles tight for up to 10 seconds. 3. Relax your muscles. Repeat this exercise 50 times a day or as many times as told by your health care provider. Continue to do this exercise for at least 4-6 weeks or for as long as told by your health care provider. This information is not intended to replace advice given to you by your health care provider.   Make sure you discuss any questions you have with your health care provider. Document Released: 08/19/2012 Document Revised: 04/27/2016 Document Reviewed: 07/23/2015 Elsevier Interactive Patient Education  2018 [employer]    

## 2017-12-15 DIAGNOSIS — J343 Hypertrophy of nasal turbinates: Secondary | ICD-10-CM | POA: Diagnosis not present

## 2017-12-15 DIAGNOSIS — Z720 Tobacco use: Secondary | ICD-10-CM | POA: Diagnosis not present

## 2017-12-15 DIAGNOSIS — J342 Deviated nasal septum: Secondary | ICD-10-CM | POA: Diagnosis not present

## 2017-12-18 DIAGNOSIS — J343 Hypertrophy of nasal turbinates: Secondary | ICD-10-CM | POA: Diagnosis not present

## 2017-12-18 DIAGNOSIS — J342 Deviated nasal septum: Secondary | ICD-10-CM | POA: Diagnosis not present

## 2018-01-07 ENCOUNTER — Ambulatory Visit: Payer: BLUE CROSS/BLUE SHIELD | Admitting: Gastroenterology

## 2018-01-07 ENCOUNTER — Encounter (INDEPENDENT_AMBULATORY_CARE_PROVIDER_SITE_OTHER): Payer: Self-pay

## 2018-01-07 ENCOUNTER — Encounter: Payer: Self-pay | Admitting: Gastroenterology

## 2018-01-07 VITALS — BP 100/74 | HR 68 | Ht 62.0 in | Wt 123.4 lb

## 2018-01-07 DIAGNOSIS — K641 Second degree hemorrhoids: Secondary | ICD-10-CM

## 2018-01-07 HISTORY — PX: HEMORRHOID BANDING: SHX5850

## 2018-01-07 NOTE — Progress Notes (Signed)
PROCEDURE NOTE: The patient presents with symptomatic grade II  hemorrhoids, requesting rubber band ligation of his/her hemorrhoidal disease.  All risks, benefits and alternative forms of therapy were described and informed consent was obtained.   The anorectum was pre-medicated with 0.125% nitroglycerine and RectiCare The decision was made to band the right anterior internal hemorrhoid, and the [place] was used to perform band ligation without complication.  Digital anorectal examination was then performed to assure proper positioning of the band, and to adjust the banded tissue as required.  The patient was discharged home without pain or other issues.  Dietary and behavioral recommendations were given and along with follow-up instructions.     The following adjunctive treatments were recommended: Kegel's to improve anal sphincter tone MiraLAX as needed Continue Benefiber 1 tablespoon 3 times daily  The patient will return  for  follow-up and possible additional banding as required. No complications were encountered and the patient tolerated the procedure well.  Damaris Hippo , MD [phone]

## 2018-01-07 NOTE — Patient Instructions (Addendum)
HEMORRHOID BANDING PROCEDURE    FOLLOW-UP CARE   1. The procedure you have had should have been relatively painless since the banding of the area involved does not have nerve endings and there is no pain sensation.  The rubber band cuts off the blood supply to the hemorrhoid and the band may fall off as soon as 48 hours after the banding (the band may occasionally be seen in the toilet bowl following a bowel movement). You may notice a temporary feeling of fullness in the rectum which should respond adequately to plain Tylenol or Motrin.  2. Following the banding, avoid strenuous exercise that evening and resume full activity the next day.  A sitz bath (soaking in a warm tub) or bidet is soothing, and can be useful for cleansing the area after bowel movements.     3. To avoid constipation, take two tablespoons of natural wheat bran, natural oat bran, flax, Benefiber or any over the counter fiber supplement and increase your water intake to 7-8 glasses daily.    4. Unless you have been prescribed anorectal medication, do not put anything inside your rectum for two weeks: No suppositories, enemas, fingers, etc.  5. Occasionally, you may have more bleeding than usual after the banding procedure.  This is often from the untreated hemorrhoids rather than the treated one.  Don't be concerned if there is a tablespoon or so of blood.  If there is more blood than this, lie flat with your bottom higher than your head and apply an ice pack to the area. If the bleeding does not stop within a half an hour or if you feel faint, call our office at [phone] or go to the emergency room.  6. Problems are not common; however, if there is a substantial amount of bleeding, severe pain, chills, fever or difficulty passing urine (very rare) or other problems, you should call us at [phone] or report to the nearest emergency room.  7. Do not stay seated continuously for more than 2-3 hours for a day or two  after the procedure.  Tighten your buttock muscles 10-15 times every two hours and take 10-15 deep breaths every 1-2 hours.  Do not spend more than a few minutes on the toilet if you cannot empty your bowel; instead re-visit the toilet at a later time.   Kegel Exercises Kegel exercises help strengthen the muscles that support the rectum, vagina, small intestine, bladder, and uterus. Doing Kegel exercises can help:  Improve bladder and bowel control.  Improve sexual response.  Reduce problems and discomfort during pregnancy.  Kegel exercises involve squeezing your pelvic floor muscles, which are the same muscles you squeeze when you try to stop the flow of urine. The exercises can be done while sitting, standing, or lying down, but it is best to vary your position. Phase 1 exercises 1. Squeeze your pelvic floor muscles tight. You should feel a tight lift in your rectal area. If you are a female, you should also feel a tightness in your vaginal area. Keep your stomach, buttocks, and legs relaxed. 2. Hold the muscles tight for up to 10 seconds. 3. Relax your muscles. Repeat this exercise 50 times a day or as many times as told by your health care provider. Continue to do this exercise for at least 4-6 weeks or for as long as told by your health care provider. This information is not intended to replace advice given to you by your health care provider.   Make sure you discuss any questions you have with your health care provider. Document Released: 08/19/2012 Document Revised: 04/27/2016 Document Reviewed: 07/23/2015 Elsevier Interactive Patient Education  2018 [employer]    

## 2018-02-02 DIAGNOSIS — L409 Psoriasis, unspecified: Secondary | ICD-10-CM | POA: Diagnosis not present

## 2018-02-02 DIAGNOSIS — L4050 Arthropathic psoriasis, unspecified: Secondary | ICD-10-CM | POA: Diagnosis not present

## 2018-02-02 DIAGNOSIS — Z720 Tobacco use: Secondary | ICD-10-CM | POA: Diagnosis not present

## 2018-02-02 DIAGNOSIS — M25431 Effusion, right wrist: Secondary | ICD-10-CM | POA: Diagnosis not present

## 2018-02-02 DIAGNOSIS — R198 Other specified symptoms and signs involving the digestive system and abdomen: Secondary | ICD-10-CM | POA: Diagnosis not present

## 2019-04-26 ENCOUNTER — Other Ambulatory Visit: Payer: Self-pay

## 2019-04-26 DIAGNOSIS — Z20822 Contact with and (suspected) exposure to covid-19: Secondary | ICD-10-CM

## 2019-04-27 LAB — NOVEL CORONAVIRUS, NAA: SARS-CoV-2, NAA: NOT DETECTED

## 2019-05-17 ENCOUNTER — Ambulatory Visit: Payer: Self-pay | Admitting: Family Medicine

## 2019-06-09 ENCOUNTER — Ambulatory Visit: Payer: 59 | Admitting: Family Medicine

## 2019-06-09 ENCOUNTER — Encounter: Payer: Self-pay | Admitting: Family Medicine

## 2019-06-09 ENCOUNTER — Other Ambulatory Visit: Payer: Self-pay

## 2019-06-09 VITALS — BP 126/80 | HR 77 | Temp 98.4°F | Ht 63.0 in | Wt 121.6 lb

## 2019-06-09 DIAGNOSIS — F17200 Nicotine dependence, unspecified, uncomplicated: Secondary | ICD-10-CM

## 2019-06-09 DIAGNOSIS — Z7689 Persons encountering health services in other specified circumstances: Secondary | ICD-10-CM

## 2019-06-09 DIAGNOSIS — Z23 Encounter for immunization: Secondary | ICD-10-CM

## 2019-06-09 DIAGNOSIS — L4050 Arthropathic psoriasis, unspecified: Secondary | ICD-10-CM

## 2019-06-09 NOTE — Progress Notes (Signed)
   Subjective:    Patient ID: Lindsay Griffin, female    DOB: 04/30/1976, 43 y.o.   MRN: [id number]  HPI Chief Complaint  Patient presents with  . new pt    new pt, get established. has obgyn, wants flu shot, no concerns   She is new to the practice and here to establish care.  Previous medical care: specialists but not PCP   Other providers:  Dr. Ronita Hipps- OB./GYN  Dr. Marella Chimes - rheumatology  Orthopedist- in the past  Dr. Gershon Crane- eyes  Dr. Silverio Decamp GI   Psoriatic arthritis- diagnosed 3 years ago. On medication and being closely followed by Dr. Pearline Cables.   States she had bronchitis in August. Symptoms resolved. Negative Covid-19 test.   Depression in the past. Has seen a therapist in the past. No medications or counseling currently. Denies having any issues with mood.  States 20 years ago or so she had problems with alcohol and severe depression.   Smoker for the past 27 years. States she is not ready to stop. Her husband also smokes.  Has tried Chantix in the past.   Reports drinking alcohol on the weekends only. Denies drug use.    LMP: 05/28/2019  Husband had a vasectomy   Married and has a 42 year old and a 42 year old. Works for [employer]   Denies fever, chills, dizziness, chest pain, palpitations, shortness of breath, abdominal pain, N/V/D, urinary symptoms, LE edema.    Reviewed allergies, medications, past medical, surgical, family, and social history.    Review of Systems Pertinent positives and negatives in the history of present illness.     Objective:   Physical Exam BP 126/80   Pulse 77   Temp 98.4 F (36.9 C)   Ht 5\' 3"  (1.6 m)   Wt 121 lb 9.6 oz (55.2 kg)   LMP 05/28/2019   Breastfeeding No   BMI 21.54 kg/m   Alert and oriented and in no acute distress.  Cardiac exam shows normal heart sounds with regular rate and rhythm . respirations unlabored.  Extremities without edema.  Skin is warm and dry.  Normal speech, mood and thought  process.      Assessment & Plan:  Smoker  Needs flu shot - Plan: Flu Vaccine QUAD 36+ mos IM  Psoriatic arthritis ([place])  Encounter to establish care  She is a pleasant 43 year old female who is new to the practice and here to establish care.  Reports being in her usual state of health.  Her main concern today is establishing care with a PCP. Smoking cessation counseling was done.  Discussed seeking cognitive behavior therapy to help when she is ready.  Discussed that [facility] offers free classes for smoking cessation.  She has trust Chantix in the past and did not like this.  Currently is not considering stopping. She has labs every 3 months with rheumatology visits. Continue seeing her specialists and follow-up here when she is ready for a fasting CPE or as needed.

## 2019-08-27 ENCOUNTER — Other Ambulatory Visit: Payer: Self-pay

## 2019-11-19 ENCOUNTER — Ambulatory Visit: Payer: Self-pay | Attending: Internal Medicine

## 2019-11-19 DIAGNOSIS — Z23 Encounter for immunization: Secondary | ICD-10-CM | POA: Insufficient documentation

## 2019-11-19 NOTE — Progress Notes (Signed)
   Covid-19 Vaccination Clinic  Name:  Lindsay Griffin    MRN: [id number] DOB: 13-Feb-1976  11/19/2019  [name] was observed post Covid-19 immunization for 15 minutes without incident. She was provided with Vaccine Information Sheet and instruction to access the V-Safe system.   [name] was instructed to call 911 with any severe reactions post vaccine: Marland Kitchen Difficulty breathing  . Swelling of face and throat  . A fast heartbeat  . A bad rash all over body  . Dizziness and weakness

## 2019-12-21 ENCOUNTER — Ambulatory Visit: Payer: Self-pay | Attending: Internal Medicine

## 2019-12-21 DIAGNOSIS — Z23 Encounter for immunization: Secondary | ICD-10-CM

## 2019-12-21 NOTE — Progress Notes (Signed)
   Covid-19 Vaccination Clinic  Name:  Lindsay Griffin    MRN: [id number] DOB: 10-04-1975  12/21/2019  [name] was observed post Covid-19 immunization for 15 minutes without incident. She was provided with Vaccine Information Sheet and instruction to access the V-Safe system.   [name] was instructed to call 911 with any severe reactions post vaccine: Marland Kitchen Difficulty breathing  . Swelling of face and throat  . A fast heartbeat  . A bad rash all over body  . Dizziness and weakness   Immunizations Administered    Name Date Dose VIS Date Route   Moderna COVID-19 Vaccine 12/21/2019 10:06 AM 0.5 mL 08/17/2019 Intramuscular   Manufacturer: Moderna   LotHQ:[id number]   [id number]

## 2019-12-28 NOTE — Progress Notes (Signed)
Subjective:    Patient ID: Lindsay Griffin, female    DOB: [birth year], 44 y.o.   MRN: [id number]  HPI Chief Complaint  Patient presents with  . fasting cpe    fasting cpe, sees obgyn, no other concerns   She is here for a complete physical exam.  Other providers: OB/GYN- Dr. Ronita Hipps  Rheumatologist- Dr. Orion Crook GI- Dr. Silverio Decamp  [facility]- hemorrhoids  Eye doctor- Dr. Gershon Crane   Smoking 1 ppd. Has tried to stop 2-3 times in the past. Is not ready to stop.  Her husband did stop smoking.   Sees Dr. Pearline Cables for psoriatic arthritis.   Hx of internal hemorrhoids.   Allergies- taking Flonase and Claritin   Social history: Lives with husband and 7 year old, her older daughter is pregnant, works as an Glass blower/designer. [employer]  Denies drug use  3-4 beers on 3 days per week  Diet: fairly healthy. Stopped eating fast food.  Excerise: starting to walk more   Immunizations: had both Covid vaccines   Health maintenance:  Mammogram: December 2020  Colonoscopy: 09/2017 Last Gynecological Exam: December 2020  Last Menstrual cycle: 12/28/2019 Last Dental Exam: October 2020 Last Eye Exam: years ago   Wears seatbelt always, uses sunscreen, smoke detectors in home and functioning, does not text while driving and feels safe in home environment.   Reviewed allergies, medications, past medical, surgical, family, and social history.   Review of Systems Review of Systems Constitutional: -fever, -chills, -sweats, -unexpected weight change,-fatigue ENT: -runny nose, -ear pain, -sore throat Cardiology:  -chest pain, -palpitations, -edema Respiratory: -cough, -shortness of breath, -wheezing Gastroenterology: -abdominal pain, -nausea, -vomiting, -diarrhea, -constipation  Hematology: -bleeding or bruising problems Musculoskeletal: -arthralgias, -myalgias, -joint swelling, -back pain Ophthalmology: -vision changes Urology: -dysuria, -difficulty urinating, -hematuria, -urinary frequency,  -urgency Neurology: -headache, -weakness, -tingling, -numbness       Objective:   Physical Exam BP 120/80   Pulse 88   Temp 97.7 F (36.5 C)   Ht 5\' 3"  (1.6 m)   Wt 123 lb 3.2 oz (55.9 kg)   LMP 12/28/2019   BMI 21.82 kg/m   General Appearance:    Alert, cooperative, no distress, appears stated age  Head:    Normocephalic, without obvious abnormality, atraumatic  Eyes:    PERRL, conjunctiva/corneas clear, EOM's intact  Ears:    Normal TM's and external ear canals  Nose:   Mask in place   Throat:   Mask in place  Neck:   Supple, no lymphadenopathy;  thyroid:  no   enlargement/tenderness/nodules; no JVD  Back:    Spine nontender, no curvature, ROM normal, no CVA     tenderness  Lungs:     Clear to auscultation to upper lobes bilaterally and LLL, RLL with course wheeze, respirations unlabored  Chest Wall:    No tenderness or deformity   Heart:    Regular rate and rhythm, S1 and S2 normal, no murmur, rub   or gallop  Breast Exam:    OB/GYN  Abdomen:     Soft, non-tender, nondistended, normoactive bowel sounds,    no masses, no hepatosplenomegaly  Genitalia:   OB/GYN     Extremities:   No clubbing, cyanosis or edema  Pulses:   2+ and symmetric all extremities  Skin:   Skin color, texture, turgor normal, no rashes or lesions  Lymph nodes:   Cervical, supraclavicular, and axillary nodes normal  Neurologic:   CNII-XII intact, normal strength, sensation and gait; reflexes  2+ and symmetric throughout          Psych:   Normal mood, affect, hygiene and grooming.        Assessment & Plan:  Routine general medical examination at a health care facility - Plan: CBC with Differential/Platelet, Comprehensive metabolic panel, TSH, T4, free, Lipid panel -here today for a fasting CPE. She is doing well overall and in good spirits.  Preventive health care reviewed and will be updated.  She has an OB/GYN. Immunizations reviewed.  Discussed healthy lifestyle including diet, exercise, smoking  cessation and alcohol recommendations.   Psoriatic arthritis ([place]) -stable and managed by Dr. Lorin Picket - Plan: DG Chest 2 View -encouraged her to stop smoking but she is not quite ready. She is aware of the potential health consequences of smoking.  Discussed that this is a risk factor for heart disease.   Abnormal lung sounds - Plan: DG Chest 2 View -she is asymptomatic. I will send her for a chest XR  Screening for lipid disorders - Plan: Lipid panel -follow up pending results.   Screening for thyroid disorder - Plan: TSH, T4, free, T3 Follow up pending results.

## 2019-12-28 NOTE — Patient Instructions (Signed)

## 2019-12-29 ENCOUNTER — Encounter: Payer: Self-pay | Admitting: Family Medicine

## 2019-12-29 ENCOUNTER — Ambulatory Visit: Payer: 59 | Admitting: Family Medicine

## 2019-12-29 ENCOUNTER — Other Ambulatory Visit: Payer: Self-pay

## 2019-12-29 ENCOUNTER — Ambulatory Visit
Admission: RE | Admit: 2019-12-29 | Discharge: 2019-12-29 | Disposition: A | Discharge status: Home / Self Care | Payer: Self-pay | Source: Ambulatory Visit | Attending: Family Medicine | Admitting: Family Medicine

## 2019-12-29 VITALS — BP 120/80 | HR 88 | Temp 97.7°F | Ht 63.0 in | Wt 123.2 lb

## 2019-12-29 DIAGNOSIS — Z13220 Encounter for screening for lipoid disorders: Secondary | ICD-10-CM

## 2019-12-29 DIAGNOSIS — Z0000 Encounter for general adult medical examination without abnormal findings: Secondary | ICD-10-CM

## 2019-12-29 DIAGNOSIS — R0989 Other specified symptoms and signs involving the circulatory and respiratory systems: Secondary | ICD-10-CM

## 2019-12-29 DIAGNOSIS — Z1329 Encounter for screening for other suspected endocrine disorder: Secondary | ICD-10-CM

## 2019-12-29 DIAGNOSIS — F17200 Nicotine dependence, unspecified, uncomplicated: Secondary | ICD-10-CM | POA: Diagnosis not present

## 2019-12-29 DIAGNOSIS — L4050 Arthropathic psoriasis, unspecified: Secondary | ICD-10-CM

## 2019-12-29 DIAGNOSIS — Z87891 Personal history of nicotine dependence: Secondary | ICD-10-CM | POA: Insufficient documentation

## 2019-12-30 ENCOUNTER — Encounter: Payer: Self-pay | Admitting: Family Medicine

## 2019-12-30 DIAGNOSIS — E7800 Pure hypercholesterolemia, unspecified: Secondary | ICD-10-CM | POA: Insufficient documentation

## 2019-12-30 HISTORY — DX: Pure hypercholesterolemia, unspecified: E78.00

## 2019-12-30 LAB — CBC WITH DIFFERENTIAL/PLATELET
Basophils Absolute: 0 10*3/uL (ref 0.0–0.2)
Basos: 0 %
EOS (ABSOLUTE): 0.2 10*3/uL (ref 0.0–0.4)
Eos: 2 %
Hematocrit: 46.2 % (ref 34.0–46.6)
Hemoglobin: 15.6 g/dL (ref 11.1–15.9)
Immature Grans (Abs): 0 10*3/uL (ref 0.0–0.1)
Immature Granulocytes: 0 %
Lymphocytes Absolute: 1.9 10*3/uL (ref 0.7–3.1)
Lymphs: 24 %
MCH: 31.8 pg (ref 26.6–33.0)
MCHC: 33.8 g/dL (ref 31.5–35.7)
MCV: 94 fL (ref 79–97)
Monocytes Absolute: 0.6 10*3/uL (ref 0.1–0.9)
Monocytes: 8 %
Neutrophils Absolute: 5 10*3/uL (ref 1.4–7.0)
Neutrophils: 66 %
Platelets: 309 10*3/uL (ref 150–450)
RBC: 4.91 x10E6/uL (ref 3.77–5.28)
RDW: 11.7 % (ref 11.7–15.4)
WBC: 7.8 10*3/uL (ref 3.4–10.8)

## 2019-12-30 LAB — COMPREHENSIVE METABOLIC PANEL
ALT: 12 IU/L (ref 0–32)
AST: 16 IU/L (ref 0–40)
Albumin/Globulin Ratio: 1.7 (ref 1.2–2.2)
Albumin: 4.6 g/dL (ref 3.8–4.8)
Alkaline Phosphatase: 70 IU/L (ref 39–117)
BUN/Creatinine Ratio: 14 (ref 9–23)
BUN: 10 mg/dL (ref 6–24)
Bilirubin Total: 0.5 mg/dL (ref 0.0–1.2)
CO2: 24 mmol/L (ref 20–29)
Calcium: 9.8 mg/dL (ref 8.7–10.2)
Chloride: 101 mmol/L (ref 96–106)
Creatinine, Ser: 0.72 mg/dL (ref 0.57–1.00)
GFR calc Af Amer: 119 mL/min/{1.73_m2} (ref >59)
GFR calc non Af Amer: 104 mL/min/{1.73_m2} (ref >59)
Globulin, Total: 2.7 g/dL (ref 1.5–4.5)
Glucose: 87 mg/dL (ref 65–99)
Potassium: 4.6 mmol/L (ref 3.5–5.2)
Sodium: 139 mmol/L (ref 134–144)
Total Protein: 7.3 g/dL (ref 6.0–8.5)

## 2019-12-30 LAB — T3: T3, Total: 91 ng/dL (ref 71–180)

## 2019-12-30 LAB — LIPID PANEL
Chol/HDL Ratio: 3.2 ratio (ref 0.0–4.4)
Cholesterol, Total: 212 mg/dL — ABNORMAL HIGH (ref 100–199)
HDL: 66 mg/dL (ref >39)
LDL Chol Calc (NIH): 131 mg/dL — ABNORMAL HIGH (ref 0–99)
Triglycerides: 82 mg/dL (ref 0–149)
VLDL Cholesterol Cal: 15 mg/dL (ref 5–40)

## 2019-12-30 LAB — TSH: TSH: 1.87 u[IU]/mL (ref 0.450–4.500)

## 2019-12-30 LAB — T4, FREE: Free T4: 1.43 ng/dL (ref 0.82–1.77)

## 2020-09-12 ENCOUNTER — Ambulatory Visit: Payer: 59 | Admitting: Allergy

## 2020-09-16 DIAGNOSIS — M6740 Ganglion, unspecified site: Secondary | ICD-10-CM

## 2020-09-16 HISTORY — DX: Ganglion, unspecified site: M67.40

## 2020-11-14 DIAGNOSIS — M069 Rheumatoid arthritis, unspecified: Secondary | ICD-10-CM | POA: Insufficient documentation

## 2020-11-14 LAB — HM MAMMOGRAPHY

## 2020-11-14 LAB — HM PAP SMEAR: HM Pap smear: NEGATIVE

## 2020-12-26 ENCOUNTER — Encounter: Payer: Self-pay | Admitting: Internal Medicine

## 2020-12-29 ENCOUNTER — Encounter: Payer: 59 | Admitting: Family Medicine

## 2021-01-02 ENCOUNTER — Encounter: Payer: Self-pay | Admitting: Family Medicine

## 2021-01-04 ENCOUNTER — Other Ambulatory Visit: Payer: Self-pay

## 2021-01-04 ENCOUNTER — Ambulatory Visit (INDEPENDENT_AMBULATORY_CARE_PROVIDER_SITE_OTHER): Payer: 59 | Admitting: Family Medicine

## 2021-01-04 ENCOUNTER — Encounter: Payer: Self-pay | Admitting: Family Medicine

## 2021-01-04 VITALS — BP 124/80 | HR 78 | Ht 63.0 in | Wt 115.0 lb

## 2021-01-04 DIAGNOSIS — E7800 Pure hypercholesterolemia, unspecified: Secondary | ICD-10-CM | POA: Diagnosis not present

## 2021-01-04 DIAGNOSIS — N920 Excessive and frequent menstruation with regular cycle: Secondary | ICD-10-CM

## 2021-01-04 DIAGNOSIS — Z0000 Encounter for general adult medical examination without abnormal findings: Secondary | ICD-10-CM

## 2021-01-04 DIAGNOSIS — F17200 Nicotine dependence, unspecified, uncomplicated: Secondary | ICD-10-CM | POA: Diagnosis not present

## 2021-01-04 DIAGNOSIS — Z1159 Encounter for screening for other viral diseases: Secondary | ICD-10-CM

## 2021-01-04 DIAGNOSIS — L4050 Arthropathic psoriasis, unspecified: Secondary | ICD-10-CM

## 2021-01-04 DIAGNOSIS — Z1329 Encounter for screening for other suspected endocrine disorder: Secondary | ICD-10-CM

## 2021-01-04 DIAGNOSIS — Z833 Family history of diabetes mellitus: Secondary | ICD-10-CM

## 2021-01-04 NOTE — Patient Instructions (Addendum)
I recommend eating a low-fat diet and increasing physical activity.  Call your OB/GYN to discuss your menstrual cycles and pelvic pain.  Call and schedule an eye exam  We will be in touch with your lab results through MyChart   Preventive Care 23-44 Years Old, Female Preventive care refers to lifestyle choices and visits with your health care provider that can promote health and wellness. This includes:  A yearly physical exam. This is also called an annual wellness visit.  Regular dental and eye exams.  Immunizations.  Screening for certain conditions.  Healthy lifestyle choices, such as: ? Eating a healthy diet. ? Getting regular exercise. ? Not using drugs or products that contain nicotine and tobacco. ? Limiting alcohol use. What can I expect for my preventive care visit? Physical exam Your health care provider will check your:  Height and weight. These may be used to calculate your BMI (body mass index). BMI is a measurement that tells if you are at a healthy weight.  Heart rate and blood pressure.  Body temperature.  Skin for abnormal spots. Counseling Your health care provider may ask you questions about your:  Past medical problems.  Family's medical history.  Alcohol, tobacco, and drug use.  Emotional well-being.  Home life and relationship well-being.  Sexual activity.  Diet, exercise, and sleep habits.  Work and work Statistician.  Access to firearms.  Method of birth control.  Menstrual cycle.  Pregnancy history. What immunizations do I need? Vaccines are usually given at various ages, according to a schedule. Your health care provider will recommend vaccines for you based on your age, medical history, and lifestyle or other factors, such as travel or where you work.   What tests do I need? Blood tests  Lipid and cholesterol levels. These may be checked every 5 years, or more often if you are over 25 years old.  Hepatitis C  test.  Hepatitis B test. Screening  Lung cancer screening. You may have this screening every year starting at age 39 if you have a 30-pack-year history of smoking and currently smoke or have quit within the past 15 years.  Colorectal cancer screening. ? All adults should have this screening starting at age 4 and continuing until age 93. ? Your health care provider may recommend screening at age 44 if you are at increased risk. ? You will have tests every 1-10 years, depending on your results and the type of screening test.  Diabetes screening. ? This is done by checking your blood sugar (glucose) after you have not eaten for a while (fasting). ? You may have this done every 1-3 years.  Mammogram. ? This may be done every 1-2 years. ? Talk with your health care provider about when you should start having regular mammograms. This may depend on whether you have a family history of breast cancer.  BRCA-related cancer screening. This may be done if you have a family history of breast, ovarian, tubal, or peritoneal cancers.  Pelvic exam and Pap test. ? This may be done every 3 years starting at age 54. ? Starting at age 52, this may be done every 5 years if you have a Pap test in combination with an HPV test. Other tests  STD (sexually transmitted disease) testing, if you are at risk.  Bone density scan. This is done to screen for osteoporosis. You may have this scan if you are at high risk for osteoporosis. Talk with your health care provider about your test  results, treatment options, and if necessary, the need for more tests. Follow these instructions at home: Eating and drinking  Eat a diet that includes fresh fruits and vegetables, whole grains, lean protein, and low-fat dairy products.  Take vitamin and mineral supplements as recommended by your health care provider.  Do not drink alcohol if: ? Your health care provider tells you not to drink. ? You are pregnant, may be  pregnant, or are planning to become pregnant.  If you drink alcohol: ? Limit how much you have to 0-1 drink a day. ? Be aware of how much alcohol is in your drink. In the [place] one drink equals one 12 oz bottle of beer (355 mL), one 5 oz glass of wine (148 mL), or one 1 oz glass of hard liquor (44 mL).   Lifestyle  Take daily care of your teeth and gums. Brush your teeth every morning and night with fluoride toothpaste. Floss one time each day.  Stay active. Exercise for at least 30 minutes 5 or more days each week.  Do not use any products that contain nicotine or tobacco, such as cigarettes, e-cigarettes, and chewing tobacco. If you need help quitting, ask your health care provider.  Do not use drugs.  If you are sexually active, practice safe sex. Use a condom or other form of protection to prevent STIs (sexually transmitted infections).  If you do not wish to become pregnant, use a form of birth control. If you plan to become pregnant, see your health care provider for a prepregnancy visit.  If told by your health care provider, take low-dose aspirin daily starting at age 20.  Find healthy ways to cope with stress, such as: ? Meditation, yoga, or listening to music. ? Journaling. ? Talking to a trusted person. ? Spending time with friends and family. Safety  Always wear your seat belt while driving or riding in a vehicle.  Do not drive: ? If you have been drinking alcohol. Do not ride with someone who has been drinking. ? When you are tired or distracted. ? While texting.  Wear a helmet and other protective equipment during sports activities.  If you have firearms in your house, make sure you follow all gun safety procedures. What's next?  Visit your health care provider once a year for an annual wellness visit.  Ask your health care provider how often you should have your eyes and teeth checked.  Stay up to date on all vaccines. This information is not intended to  replace advice given to you by your health care provider. Make sure you discuss any questions you have with your health care provider. Document Revised: 06/06/2020 Document Reviewed: 05/14/2018 Elsevier Patient Education  2021 [employer].

## 2021-01-04 NOTE — Progress Notes (Signed)
Subjective:    Patient ID: Lindsay Griffin, female    DOB: 06-21-1976, 45 y.o.   MRN: [id number]  HPI Chief Complaint  Patient presents with  . fasting cpe    Fasting cpe  has obgyn , no concerns   She is new to the practice and here for a complete physical exam.  Other providers: Dr. Ronita Hipps- OB/GYN  Dr. Pearline Cables- [facility] for hemorrhoids    States her mood is good.  States she has been dealing with a lot of death recently.    methotrexate on Wednesdays. She is not currently on prednisone  Infusions in rheumatology office every 8 weeks   Family history of diabetes   Reports history of ovarian cysts and recently her periods are more painful and she is having lower abdominal pain. Plans to see Dr. Ronita Hipps for this.   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   Reviewed allergies, medications, past medical, surgical, family, and social history.     Review of Systems Review of Systems Constitutional: -fever, -chills, -sweats, -unexpected weight change,-fatigue ENT: -runny nose, -ear pain, -sore throat Cardiology:  -chest pain, -palpitations, -edema Respiratory: -cough, -shortness of breath, -wheezing Gastroenterology: -abdominal pain, -nausea, -vomiting, -diarrhea, -constipation  Hematology: -bleeding or bruising problems Musculoskeletal: +arthralgias, -myalgias, -joint swelling, -back pain Ophthalmology: -vision changes Urology:  -dysuria, -difficulty urinating, -hematuria, -urinary frequency, -urgency Neurology: -headache, -weakness, -tingling, -numbness       Objective:   Physical Exam BP 124/80   Pulse 78   Ht 5\' 3"  (1.6 m)   Wt 115 lb (52.2 kg)   LMP 01/02/2021   SpO2 98%   BMI 20.37 kg/m   General Appearance:    Alert, cooperative, no distress, appears stated age  Head:    Normocephalic, without obvious abnormality, atraumatic  Eyes:    PERRL, conjunctiva/corneas clear, EOM's intact  Ears:    Normal TM's and external ear canals  Nose:   Mask on  Throat:   Mask on   Neck:   Supple, no lymphadenopathy;  thyroid:  no   enlargement/tenderness/nodules; no JVD  Back:    Spine nontender, no curvature, ROM normal, no CVA     tenderness  Lungs:     Clear to auscultation bilaterally without wheezes, rales or     ronchi; respirations unlabored  Chest Wall:    No tenderness or deformity   Heart:    Regular rate and rhythm, S1 and S2 normal, no murmur, rub   or gallop  Breast Exam:    OB/GYN  Abdomen:     Soft, nondistended, normoactive bowel sounds, TTP to bilateral lower abdomen without rebound or guarding,    no masses, no hepatosplenomegaly  Genitalia:    OB/GYN     Extremities:   No clubbing, cyanosis or edema  Pulses:   2+ and symmetric all extremities  Skin:   Skin color, texture, turgor normal, no rashes or lesions  Lymph nodes:   Cervical,  supraclavicular, and axillary nodes normal  Neurologic:   CNII-XII intact, normal strength, sensation and gait          Psych:   Normal mood, affect, hygiene and grooming.         Assessment & Plan:  Routine general medical examination at a health care facility - Plan: CBC with Differential/Platelet, Comprehensive metabolic panel, TSH, T4, free Preventive health care reviewed.  She sees her OB/GYN.  Colonoscopy is up-to-date.  Counseling on healthy lifestyle including diet and exercise.  Recommend regular dental and eye exams.  Immunizations reviewed.   Discussed safety and health promotion including using sunscreen  Elevated LDL cholesterol level - Plan: Lipid panel -Recommend a low-fat, low-cholesterol diet and increasing physical activity.  Smoker -She is not ready to stop and is aware of potential health consequences  Psoriatic arthritis ([place]) -Managed by rheumatology  Need for hepatitis C screening test - Plan: Hepatitis C antibody -Done per screening guidelines  Family history of diabetes mellitus in first degree relative - Plan: Hemoglobin A1c -She is concerned about diabetes and requests testing.  Discussed eating a low-carb diet and increasing physical activity  Menorrhagia with regular cycle -She will call and schedule with Dr. Ronita Hipps on her OB/GYN.  Screening for thyroid disorder - Plan: TSH, T4, free

## 2021-01-05 LAB — T4, FREE: Free T4: 1.58 ng/dL (ref 0.82–1.77)

## 2021-01-05 LAB — COMPREHENSIVE METABOLIC PANEL
ALT: 12 IU/L (ref 0–32)
AST: 20 IU/L (ref 0–40)
Albumin/Globulin Ratio: 1.9 (ref 1.2–2.2)
Albumin: 4.6 g/dL (ref 3.8–4.8)
Alkaline Phosphatase: 52 IU/L (ref 44–121)
BUN/Creatinine Ratio: 18 (ref 9–23)
BUN: 10 mg/dL (ref 6–24)
Bilirubin Total: 0.8 mg/dL (ref 0.0–1.2)
CO2: 21 mmol/L (ref 20–29)
Calcium: 9.7 mg/dL (ref 8.7–10.2)
Chloride: 100 mmol/L (ref 96–106)
Creatinine, Ser: 0.57 mg/dL (ref 0.57–1.00)
Globulin, Total: 2.4 g/dL (ref 1.5–4.5)
Glucose: 89 mg/dL (ref 65–99)
Potassium: 4.5 mmol/L (ref 3.5–5.2)
Sodium: 139 mmol/L (ref 134–144)
Total Protein: 7 g/dL (ref 6.0–8.5)
eGFR: 116 mL/min/{1.73_m2} (ref >59)

## 2021-01-05 LAB — CBC WITH DIFFERENTIAL/PLATELET
Basophils Absolute: 0 10*3/uL (ref 0.0–0.2)
Basos: 0 %
EOS (ABSOLUTE): 0.2 10*3/uL (ref 0.0–0.4)
Eos: 2 %
Hematocrit: 42.6 % (ref 34.0–46.6)
Hemoglobin: 14.8 g/dL (ref 11.1–15.9)
Immature Grans (Abs): 0 10*3/uL (ref 0.0–0.1)
Immature Granulocytes: 1 %
Lymphocytes Absolute: 1.8 10*3/uL (ref 0.7–3.1)
Lymphs: 22 %
MCH: 33.9 pg — ABNORMAL HIGH (ref 26.6–33.0)
MCHC: 34.7 g/dL (ref 31.5–35.7)
MCV: 98 fL — ABNORMAL HIGH (ref 79–97)
Monocytes Absolute: 0.7 10*3/uL (ref 0.1–0.9)
Monocytes: 8 %
Neutrophils Absolute: 5.3 10*3/uL (ref 1.4–7.0)
Neutrophils: 67 %
Platelets: 333 10*3/uL (ref 150–450)
RBC: 4.37 x10E6/uL (ref 3.77–5.28)
RDW: 12.4 % (ref 11.7–15.4)
WBC: 8 10*3/uL (ref 3.4–10.8)

## 2021-01-05 LAB — LIPID PANEL
Chol/HDL Ratio: 2.4 ratio (ref 0.0–4.4)
Cholesterol, Total: 178 mg/dL (ref 100–199)
HDL: 74 mg/dL (ref >39)
LDL Chol Calc (NIH): 93 mg/dL (ref 0–99)
Triglycerides: 59 mg/dL (ref 0–149)
VLDL Cholesterol Cal: 11 mg/dL (ref 5–40)

## 2021-01-05 LAB — HEMOGLOBIN A1C
Est. average glucose Bld gHb Est-mCnc: 103 mg/dL
Hgb A1c MFr Bld: 5.2 % (ref 4.8–5.6)

## 2021-01-05 LAB — HEPATITIS C ANTIBODY: Hep C Virus Ab: <0.1 s/co ratio (ref 0.0–0.9)

## 2021-01-05 LAB — TSH: TSH: 1.5 u[IU]/mL (ref 0.450–4.500)

## 2021-04-12 ENCOUNTER — Encounter: Payer: Self-pay | Admitting: Family Medicine

## 2021-05-14 DIAGNOSIS — H15009 Unspecified scleritis, unspecified eye: Secondary | ICD-10-CM

## 2021-05-14 HISTORY — DX: Unspecified scleritis, unspecified eye: H15.009

## 2021-08-08 ENCOUNTER — Encounter (HOSPITAL_BASED_OUTPATIENT_CLINIC_OR_DEPARTMENT_OTHER): Payer: Self-pay | Admitting: Orthopedic Surgery

## 2021-08-08 NOTE — H&P (Signed)
Preoperative History & Physical Exam  Surgeon: Matt Holmes, MD  Diagnosis: Right dorsal carpal ganglion cyst  Planned Procedure: Procedure(s) (LRB): Right dorsal carpal ganglion cyst excision (Right)  History of Present Illness:   Patient is a 45 y.o. female with symptoms consistent with  Right dorsal carpal ganglion cyst who presents for surgical intervention. The risks, benefits and alternatives of surgical intervention were discussed and informed consent was obtained prior to surgery.  Past Medical History:  Past Medical History:  Diagnosis Date   Abnormal Pap smear    Acute upper respiratory infections of unspecified site    AMA (advanced maternal age) multigravida 35+    Anemia    Anxiety    Arthritis    Elevated LDL cholesterol level 12/30/2019   External hemorrhoids without mention of complication    Ganglion cyst 2022   right dorsal carpal ganglion cyst   H/O varicella    Lichen sclerosus    Mastodynia    Other specified visual disturbances    with pregnancy   Psoriasis    Psoriatic arthritis ([place])    manged by Dr. Pearline Cables   Retention of urine, unspecified    Rubella without mention of complication    unsure if measles or allergic reaction to sulfa   Scleritis 05/14/2021   right eye    Past Surgical History:  Past Surgical History:  Procedure Laterality Date   BREAST BIOPSY Left 04/28/2017   COLONOSCOPY  10/03/2017   CRYOTHERAPY  1994   cryotherapy of vaginal lesion   DILATION AND CURETTAGE OF UTERUS  07/17/2010   FOOT SURGERY     R x2   HEMORRHOID BANDING  01/07/2018   LASIK      Medications:  Prior to Admission medications   Medication Sig Start Date End Date Taking? Authorizing Provider  folic acid (FOLVITE) 1 MG tablet Take 3 mg by mouth daily. 09/28/20   [provider]  loratadine (CLARITIN) 10 MG tablet Take 10 mg by mouth daily.    [provider]  methotrexate (RHEUMATREX) 2.5 MG tablet Take 15 mg by mouth once a week.  11/27/20   [provider]  predniSONE (DELTASONE) 5 MG tablet Take by mouth. Patient not taking: Reported on 01/04/2021 12/25/20   [provider]    Allergies:  Sulfa antibiotics  Review of Systems: Negative except per HPI.  Physical Exam: Alert and oriented, NAD Head and neck: no masses, normal alignment CV: pulse intact Pulm: no increased work of breathing, respirations even and unlabored Abdomen: non-distended Extremities: extremities warm and well perfused  LABS: No results found for this or any previous visit (from the past 2160 hour(s)).   Complete History and Physical exam available in the office notes  Lindsay Griffin

## 2021-08-13 ENCOUNTER — Encounter (HOSPITAL_BASED_OUTPATIENT_CLINIC_OR_DEPARTMENT_OTHER): Payer: Self-pay | Admitting: Orthopedic Surgery

## 2021-08-13 ENCOUNTER — Other Ambulatory Visit: Payer: Self-pay

## 2021-08-13 NOTE — Progress Notes (Signed)
Spoke w/ via phone for pre-op interview---pt Lab needs dos---- urine pregnancy POCT              Lab results------none COVID test on 11/28 or 08/14/21  Arrive at -------1015 NPO after MN NO Solid Food.  Clear liquids from MN until---0915 Med rec completed Medications to take morning of surgery -----Allegra, Flonase, Pataday Diabetic medication -----n/a Patient instructed no nail polish to be worn day of surgery Patient instructed to bring photo id and insurance card day of surgery Patient aware to have Driver (ride ) / caregiver    for 24 hours after surgery - husband Lindsay Griffin Patient Special Instructions -----none Pre-Op special Istructions -----none Patient verbalized understanding of instructions that were given at this phone interview. Patient denies shortness of breath, chest pain, fever, cough at this phone interview.

## 2021-08-14 ENCOUNTER — Other Ambulatory Visit: Payer: Self-pay | Admitting: Orthopedic Surgery

## 2021-08-14 LAB — SARS CORONAVIRUS 2 (TAT 6-24 HRS): SARS Coronavirus 2: NEGATIVE

## 2021-08-16 ENCOUNTER — Ambulatory Visit (HOSPITAL_BASED_OUTPATIENT_CLINIC_OR_DEPARTMENT_OTHER): Payer: 59 | Admitting: Anesthesiology

## 2021-08-16 ENCOUNTER — Encounter (HOSPITAL_BASED_OUTPATIENT_CLINIC_OR_DEPARTMENT_OTHER): Payer: Self-pay | Admitting: Orthopedic Surgery

## 2021-08-16 ENCOUNTER — Other Ambulatory Visit: Payer: Self-pay

## 2021-08-16 ENCOUNTER — Encounter (HOSPITAL_BASED_OUTPATIENT_CLINIC_OR_DEPARTMENT_OTHER)
Admission: RE | Disposition: A | Discharge status: Home / Self Care | Payer: Self-pay | Source: Ambulatory Visit | Attending: Orthopedic Surgery

## 2021-08-16 ENCOUNTER — Ambulatory Visit (HOSPITAL_BASED_OUTPATIENT_CLINIC_OR_DEPARTMENT_OTHER)
Admission: RE | Admit: 2021-08-16 | Discharge: 2021-08-16 | Disposition: A | Discharge status: Home / Self Care | Payer: 59 | Source: Ambulatory Visit | Attending: Orthopedic Surgery | Admitting: Orthopedic Surgery

## 2021-08-16 DIAGNOSIS — F17210 Nicotine dependence, cigarettes, uncomplicated: Secondary | ICD-10-CM | POA: Diagnosis not present

## 2021-08-16 DIAGNOSIS — M67439 Ganglion, unspecified wrist: Secondary | ICD-10-CM

## 2021-08-16 DIAGNOSIS — M67431 Ganglion, right wrist: Secondary | ICD-10-CM | POA: Diagnosis not present

## 2021-08-16 DIAGNOSIS — L4050 Arthropathic psoriasis, unspecified: Secondary | ICD-10-CM | POA: Diagnosis not present

## 2021-08-16 HISTORY — PX: GANGLION CYST EXCISION: SHX1691

## 2021-08-16 HISTORY — DX: Psoriasis, unspecified: L40.9

## 2021-08-16 HISTORY — DX: Lichen sclerosus et atrophicus: L90.0

## 2021-08-16 LAB — PREGNANCY, URINE: Preg Test, Ur: NEGATIVE

## 2021-08-16 SURGERY — EXCISION, GANGLION CYST, WRIST
Anesthesia: Monitor Anesthesia Care | Site: Wrist | Laterality: Right

## 2021-08-16 MED ORDER — DEXMEDETOMIDINE (PRECEDEX) IN NS 20 MCG/5ML (4 MCG/ML) IV SYRINGE
PREFILLED_SYRINGE | INTRAVENOUS | Status: AC
  Filled 2021-08-16: qty 5

## 2021-08-16 MED ORDER — HYDROCODONE-ACETAMINOPHEN 5-325 MG PO TABS: 1.0000 | ORAL_TABLET | Freq: Four times a day (QID) | ORAL | 0 refills | Status: AC | PRN

## 2021-08-16 MED ORDER — LIDOCAINE HCL (PF) 1 % IJ SOLN
INTRAMUSCULAR | Status: DC | PRN
  Administered 2021-08-16: 5 mL

## 2021-08-16 MED ORDER — CEFAZOLIN SODIUM-DEXTROSE 2-4 GM/100ML-% IV SOLN
INTRAVENOUS | Status: AC
  Filled 2021-08-16: qty 100

## 2021-08-16 MED ORDER — PROPOFOL 10 MG/ML IV BOLUS
INTRAVENOUS | Status: DC | PRN
  Administered 2021-08-16: 30 mg via INTRAVENOUS

## 2021-08-16 MED ORDER — LIDOCAINE HCL (CARDIAC) PF 100 MG/5ML IV SOSY
PREFILLED_SYRINGE | INTRAVENOUS | Status: DC | PRN
  Administered 2021-08-16: 40 mg via INTRAVENOUS

## 2021-08-16 MED ORDER — LACTATED RINGERS IV SOLN: INTRAVENOUS | Status: DC

## 2021-08-16 MED ORDER — PROPOFOL 500 MG/50ML IV EMUL
INTRAVENOUS | Status: DC | PRN
  Administered 2021-08-16: 75 ug/kg/min via INTRAVENOUS

## 2021-08-16 MED ORDER — CEFAZOLIN SODIUM-DEXTROSE 2-4 GM/100ML-% IV SOLN
2.0000 g | INTRAVENOUS | Status: AC
  Administered 2021-08-16: 2 g via INTRAVENOUS

## 2021-08-16 MED ORDER — FENTANYL CITRATE (PF) 100 MCG/2ML IJ SOLN
100.0000 ug | Freq: Once | INTRAMUSCULAR | Status: AC
  Administered 2021-08-16: 100 ug via INTRAVENOUS

## 2021-08-16 MED ORDER — FENTANYL CITRATE (PF) 100 MCG/2ML IJ SOLN
INTRAMUSCULAR | Status: AC
  Filled 2021-08-16: qty 2

## 2021-08-16 MED ORDER — MIDAZOLAM HCL 2 MG/2ML IJ SOLN
2.0000 mg | Freq: Once | INTRAMUSCULAR | Status: AC
  Administered 2021-08-16: 2 mg via INTRAVENOUS

## 2021-08-16 MED ORDER — LIDOCAINE 2% (20 MG/ML) 5 ML SYRINGE
INTRAMUSCULAR | Status: AC
  Filled 2021-08-16: qty 5

## 2021-08-16 MED ORDER — PROPOFOL 500 MG/50ML IV EMUL
INTRAVENOUS | Status: AC
  Filled 2021-08-16: qty 50

## 2021-08-16 MED ORDER — ONDANSETRON HCL 4 MG/2ML IJ SOLN
INTRAMUSCULAR | Status: DC | PRN
  Administered 2021-08-16: 4 mg via INTRAVENOUS

## 2021-08-16 MED ORDER — ROPIVACAINE HCL 5 MG/ML IJ SOLN
INTRAMUSCULAR | Status: DC | PRN
  Administered 2021-08-16: 15 mL via EPIDURAL

## 2021-08-16 MED ORDER — ONDANSETRON HCL 4 MG/2ML IJ SOLN
INTRAMUSCULAR | Status: AC
  Filled 2021-08-16: qty 2

## 2021-08-16 MED ORDER — MIDAZOLAM HCL 2 MG/2ML IJ SOLN
INTRAMUSCULAR | Status: AC
  Filled 2021-08-16: qty 2

## 2021-08-16 SURGICAL SUPPLY — 36 items
BLADE SURG 15 STRL LF DISP TIS (BLADE) ×1 IMPLANT
BLADE SURG 15 STRL SS (BLADE) ×2
BNDG CMPR 9X4 STRL LF SNTH (GAUZE/BANDAGES/DRESSINGS) ×1
BNDG CMPR STD VLCR NS LF 5.8X4 (GAUZE/BANDAGES/DRESSINGS) ×1
BNDG ELASTIC 4X5.8 VLCR NS LF (GAUZE/BANDAGES/DRESSINGS) ×1 IMPLANT
BNDG ELASTIC 4X5.8 VLCR STR LF (GAUZE/BANDAGES/DRESSINGS) ×2 IMPLANT
BNDG ESMARK 4X9 LF (GAUZE/BANDAGES/DRESSINGS) ×2 IMPLANT
CORD BIPOLAR FORCEPS 12FT (ELECTRODE) ×1 IMPLANT
COVER BACK TABLE 60X90IN (DRAPES) ×2 IMPLANT
CUFF TOURN SGL QUICK 18 (TOURNIQUET CUFF) ×1 IMPLANT
CUFF TOURN SGL QUICK 24 (TOURNIQUET CUFF)
CUFF TRNQT CYL 24X4X16.5-23 (TOURNIQUET CUFF) IMPLANT
DRAPE EXTREMITY T 121X128X90 (DISPOSABLE) ×2 IMPLANT
DRAPE SHEET LG 3/4 BI-LAMINATE (DRAPES) ×2 IMPLANT
DRAPE SURG 17X23 STRL (DRAPES) ×2 IMPLANT
GAUZE 4X4 16PLY ~~LOC~~+RFID DBL (SPONGE) ×2 IMPLANT
GAUZE SPONGE 4X4 12PLY STRL (GAUZE/BANDAGES/DRESSINGS) ×2 IMPLANT
GAUZE XEROFORM 1X8 LF (GAUZE/BANDAGES/DRESSINGS) ×2 IMPLANT
GLOVE SURG ENC MOIS LTX SZ7.5 (GLOVE) ×2 IMPLANT
GOWN STRL REUS W/ TWL LRG LVL3 (GOWN DISPOSABLE) ×1 IMPLANT
GOWN STRL REUS W/TWL LRG LVL3 (GOWN DISPOSABLE) ×2
HIBICLENS CHG 4% 4OZ BTL (MISCELLANEOUS) ×2 IMPLANT
KIT TURNOVER CYSTO (KITS) ×2 IMPLANT
NDL HYPO 25X1 1.5 SAFETY (NEEDLE) ×1 IMPLANT
NEEDLE HYPO 25X1 1.5 SAFETY (NEEDLE) ×2 IMPLANT
NS IRRIG 1000ML POUR BTL (IV SOLUTION) ×2 IMPLANT
PACK BASIN DAY SURGERY FS (CUSTOM PROCEDURE TRAY) ×2 IMPLANT
PAD CAST 4YDX4 CTTN HI CHSV (CAST SUPPLIES) ×1 IMPLANT
PADDING CAST COTTON 4X4 STRL (CAST SUPPLIES) ×2
SLING ARM FOAM STRAP SML (SOFTGOODS) ×1 IMPLANT
SUT ETHILON 4 0 PS 2 18 (SUTURE) IMPLANT
SYR 10ML LL (SYRINGE) ×1 IMPLANT
SYR BULB EAR ULCER 3OZ GRN STR (SYRINGE) ×2 IMPLANT
TOWEL OR 17X26 10 PK STRL BLUE (TOWEL DISPOSABLE) ×2 IMPLANT
TRAY DSU PREP LF (CUSTOM PROCEDURE TRAY) ×2 IMPLANT
UNDERPAD 30X36 HEAVY ABSORB (UNDERPADS AND DIAPERS) ×2 IMPLANT

## 2021-08-16 NOTE — Anesthesia Postprocedure Evaluation (Signed)
Anesthesia Post Note  Patient: Lindsay Griffin  Procedure(s) Performed: Right dorsal carpal ganglion cyst excision (Right: Wrist)     Patient location during evaluation: PACU Anesthesia Type: Regional and MAC Level of consciousness: awake and alert Pain management: pain level controlled Vital Signs Assessment: post-procedure vital signs reviewed and stable Respiratory status: spontaneous breathing, nonlabored ventilation and respiratory function stable Cardiovascular status: blood pressure returned to baseline and stable Postop Assessment: no apparent nausea or vomiting Anesthetic complications: no   No notable events documented.  Last Vitals:  Vitals:   08/16/21 1147 08/16/21 1200  BP: (!) 140/95 (!) 139/94  Pulse: 80 71  Resp: 19 15  Temp: 36.7 C   SpO2: 96% 99%    Last Pain:  Vitals:   08/16/21 1147  TempSrc: Oral  PainSc:                  Pervis Hocking

## 2021-08-16 NOTE — Op Note (Addendum)
OPERATIVE NOTE  DATE OF PROCEDURE: 08/16/2021  SURGEONS:  Primary: Orene Desanctis, MD  PREOPERATIVE DIAGNOSIS: Right dorsal carpal ganglion cyst  POSTOPERATIVE DIAGNOSIS: Same  NAME OF PROCEDURE:   RIGHT Dorsal Carpal Ganglion Cyst Excision  ANESTHESIA: Monitor Anesthesia Care + Block  SKIN PREPARATION: Hibiclens  ESTIMATED BLOOD LOSS: Minimal  SPECIMEN: yes- sent to pathology  INDICATIONS:  Lindsay Griffin is a 45 y.o. female who has the above preoperative diagnosis. The patient has decided to proceed with surgical intervention.  Risks, benefits and alternatives of operative management were discussed including, but not limited to, risks of anesthesia complications, infection, pain, persistent symptoms, stiffness, need for future surgery.  The patient understands, agrees and elects to proceed with surgery.    DESCRIPTION OF PROCEDURE: The patient was met in the pre-operative area and their identity was verified.  The operative location and laterality was also verified and marked.  The patient was brought to the OR and was placed supine on the table.  After repeat patient identification with the operative team anesthesia was provided and the patient was prepped and draped in the usual sterile fashion.  A final timeout was performed verifying the correction patient, procedure, location and laterality.  Preoperative IV antibiotics were provided. The RIGHT upper extremity was elevated and exsanguinated with an esmarch and tourniquet inflated to 250 mmHg. A longitudinal incision was made over the cyst and skin and subcutaneous tissues were divided. The cyst was encountered and stalk was identified down to the level of the SL joint. The cyst contents were drained, the stalk and entire cyst sac were excised. A dorsal capsulectomy was performed. Care was taken to protect the extensor tendons. The specimen was then sent to pathology. The wound was then irrigated with normal saline. Closure was performed  with 4-0 nylon suture in horizontal mattress fashion. A sterile soft bandage was applied. The tourniquet was deflated and fingers pink and warm and well perfused. All counts were correct x 2. The patient tolerated the procedure well, was awoken from anesthesia and brought to PACU for recovery in stable condition.   Matt Holmes, MD

## 2021-08-16 NOTE — Anesthesia Preprocedure Evaluation (Addendum)
Anesthesia Evaluation  Patient identified by MRN, date of birth, ID band Patient awake    Reviewed: Allergy & Precautions, NPO status , Patient's Chart, lab work & pertinent test results  Airway Mallampati: I  TM Distance: >3 FB Neck ROM: Full    Dental  (+) Teeth Intact, Dental Advisory Given   Pulmonary Current Smoker,  Current every day smoker, 27 pack year history    Pulmonary exam normal breath sounds clear to auscultation       Cardiovascular negative cardio ROS Normal cardiovascular exam Rhythm:Regular Rate:Normal     Neuro/Psych PSYCHIATRIC DISORDERS Anxiety Depression negative neurological ROS     GI/Hepatic negative GI ROS, (+)     substance abuse (hx EtOH abuse)  alcohol use,   Endo/Other  negative endocrine ROS  Renal/GU negative Renal ROS  negative genitourinary   Musculoskeletal  (+) Arthritis  (psoriatic arthritis), R Ganglion cyst    Abdominal   Peds  Hematology negative hematology ROS (+)   Anesthesia Other Findings   Reproductive/Obstetrics negative OB ROS                            Anesthesia Physical Anesthesia Plan  ASA: 2  Anesthesia Plan: MAC and Regional   Post-op Pain Management: Regional block   Induction:   PONV Risk Score and Plan: 2 and Propofol infusion and TIVA  Airway Management Planned: Natural Airway and Simple Face Mask  Additional Equipment: None  Intra-op Plan:   Post-operative Plan:   Informed Consent: I have reviewed the patients History and Physical, chart, labs and discussed the procedure including the risks, benefits and alternatives for the proposed anesthesia with the patient or authorized representative who has indicated his/her understanding and acceptance.     Dental advisory given  Plan Discussed with: CRNA  Anesthesia Plan Comments:       Anesthesia Quick Evaluation

## 2021-08-16 NOTE — Transfer of Care (Signed)
Immediate Anesthesia Transfer of Care Note  Patient: Lindsay Griffin  Procedure(s) Performed: Right dorsal carpal ganglion cyst excision (Right: Wrist)  Patient Location: PACU  Anesthesia Type:MAC and Regional  Level of Consciousness: awake, alert  and oriented  Airway & Oxygen Therapy: Patient Spontanous Breathing and Patient connected to nasal cannula oxygen  Post-op Assessment: Report given to RN and Post -op Vital signs reviewed and stable  Post vital signs: Reviewed and stable  Last Vitals:  Vitals Value Taken Time  BP 141/97 08/16/21 1143  Temp    Pulse 81 08/16/21 1144  Resp 20 08/16/21 1144  SpO2 97 % 08/16/21 1144  Vitals shown include unvalidated device data.  Last Pain:  Vitals:   08/16/21 1104  TempSrc: Oral  PainSc: 0-No pain      Patients Stated Pain Goal: 5 (60/04/59 9774)  Complications: No notable events documented.

## 2021-08-16 NOTE — Discharge Instructions (Addendum)
Orthopaedic Hand Surgery Discharge Instructions  WEIGHT BEARING STATUS: Non weight bearing on operative extremity  DRESSINGS: Please keep your dressing/splint/cast clean and dry until your follow-up appointment. You may shower by placing a waterproof covering over your dressing/splint/cast. Contact your surgeon if your splint/cast gets wet. It will need to be changed to prevent skin breakdown.  PAIN CONTROL: First line medications for post operative pain control are Tylenol (acetaminophen) and Motrin (ibuprofen) if you are able to take these medications. If you have been prescribed a medication these can be taken as breakthrough pain medications. Please note that some narcotic pain medication have acetaminophen added and you should never consume more than 4,000mg  of acetaminophen in 24 hour period. Also please note that if you are given Toradol (ketoralac) you should not take similar medications simultaneously such as ibuprofen.   ICE/ELEVATION: Ice and elevate your injured extremity as needed. Avoid direct contact of ice with skin.  HOME MEDICATIONS: No changes have been made to your home medications.  FOLLOW UP: You will be called after surgery with an appointment date and time, however if you have not received a phone call within 3 days please call during regular office hours at [phone] to schedule a post operative appointment.  Please Seek Medical Attention if: Call MD for: pain or pressure in chest, jaw, arm, back, neck  Call MD for: temperature greater than 101 F for more than 24 hours  Call MD for: difficulty breathing Call MD for: Incision redness, bleeding, drainage  Call MD for: palpitations or feeling that the heart is racing  Call MD for: increased swelling in arm, leg, ankle, or abdomen  Call MD for: lightheadedness, dizziness, fainting Go to ED or call 911 if: chest pain does not go away after 3 nitroglycerin doses taken 5 min apart  Go to ED or call 911 for: any  uncontrolled bleeding  Go to ED or call 911 if: unable to reach physician  Discharge Medications: Allergies as of 08/16/2021       Reactions   Sulfa Antibiotics Rash   As a child        Medication List     TAKE these medications    B-complex with vitamin C tablet Take 1 tablet by mouth daily.   fexofenadine 180 MG tablet Commonly known as: ALLEGRA Take 180 mg by mouth daily. Pt unsure of dosage.   fluticasone 50 MCG/ACT nasal spray Commonly known as: FLONASE Place into both nostrils daily.   golimumab 50 MG/4ML Soln injection Commonly known as: SIMPONI ARIA Inject into the vein. Every other month,last taken end of 05/2021.   HYDROcodone-acetaminophen 5-325 MG tablet Commonly known as: NORCO/VICODIN Take 1 tablet by mouth every 6 (six) hours as needed for up to 3 days for moderate pain.   ibuprofen 800 MG tablet Commonly known as: ADVIL Take 800 mg by mouth every 8 (eight) hours as needed.   PATADAY OP Apply to eye daily.   polyethylene glycol 17 g packet Commonly known as: MIRALAX / GLYCOLAX Take 17 g by mouth daily.   predniSONE 5 MG tablet Commonly known as: DELTASONE Take by mouth. Just finished a pack of prednisone for an upper respiratory infection on 08/13/21.   Voltaren 1 % Gel Generic drug: diclofenac Sodium Apply topically as needed.   Vtama 1 % Crea Generic drug: Tapinarof Apply topically daily.          Izell Pitts, MD Orthopaedic Hand Surgeon [facility] Office number: [phone] [address]  [place], [place] [address]  Post Anesthesia Home Care Instructions  Activity: Get plenty of rest for the remainder of the day. A responsible adult should stay with you for 24 hours following the procedure.  For the next 24 hours, DO NOT: -Drive a car -Paediatric nurse -Drink alcoholic beverages -Take any medication unless instructed by your physician -Make any legal decisions or sign important papers.  Meals: Start  with liquid foods such as gelatin or soup. Progress to regular foods as tolerated. Avoid greasy, spicy, heavy foods. If nausea and/or vomiting occur, drink only clear liquids until the nausea and/or vomiting subsides. Call your physician if vomiting continues.  Special Instructions/Symptoms: Your throat may feel dry or sore from the anesthesia or the breathing tube placed in your throat during surgery. If this causes discomfort, gargle with warm salt water. The discomfort should disappear within 24 hours.     Regional Anesthesia Blocks  1. Numbness or the inability to move the "blocked" extremity may last from 3-48 hours after placement. The length of time depends on the medication injected and your individual response to the medication. If the numbness is not going away after 48 hours, call your surgeon.  2. The extremity that is blocked will need to be protected until the numbness is gone and the  Strength has returned. Because you cannot feel it, you will need to take extra care to avoid injury. Because it may be weak, you may have difficulty moving it or using it. You may not know what position it is in without looking at it while the block is in effect.  3. For blocks in the legs and feet, returning to weight bearing and walking needs to be done carefully. You will need to wait until the numbness is entirely gone and the strength has returned. You should be able to move your leg and foot normally before you try and bear weight or walk. You will need someone to be with you when you first try to ensure you do not fall and possibly risk injury.  4. Bruising and tenderness at the needle site are common side effects and will resolve in a few days.  5. Persistent numbness or new problems with movement should be communicated to the surgeon or the [facility] [phone] [place] [phone]).

## 2021-08-16 NOTE — Anesthesia Procedure Notes (Signed)
Anesthesia Regional Block: Supraclavicular block   Pre-Anesthetic Checklist: , timeout performed,  Correct Patient, Correct Site, Correct Laterality,  Correct Procedure, Correct Position, site marked,  Risks and benefits discussed,  Surgical consent,  Pre-op evaluation,  At surgeon's request and post-op pain management  Laterality: Left  Prep: Maximum Sterile Barrier Precautions used, chloraprep       Needles:  Injection technique: Single-shot  Needle Type: Echogenic Stimulator Needle     Needle Length: 9cm  Needle Gauge: 22     Additional Needles:   Procedures:,,,, ultrasound used (permanent image in chart),,    Narrative:  Start time: 08/16/2021 10:55 AM End time: 08/16/2021 11:00 AM Injection made incrementally with aspirations every 5 mL.  Performed by: Personally  Anesthesiologist: Pervis Hocking, DO  Additional Notes: Monitors applied. No increased pain on injection. No increased resistance to injection. Injection made in 5cc increments. Good needle visualization. Patient tolerated procedure well.

## 2021-08-16 NOTE — Progress Notes (Signed)
Assisted Dr. Doroteo Glassman with right, ultrasound guided, supraclavicular block. Side rails up, monitors on throughout procedure. See vital signs in flow sheet. Tolerated Procedure well.

## 2021-08-16 NOTE — Interval H&P Note (Signed)
History and Physical Interval Note:  08/16/2021 10:40 AM  Lindsay Griffin  has presented today for surgery, with the diagnosis of Right dorsal carpal ganglion cyst.  The various methods of treatment have been discussed with the patient and family. After consideration of risks, benefits and other options for treatment, the patient has consented to  Procedure(s) with comments: Right dorsal carpal ganglion cyst excision (Right) - with local anesthesia as a surgical intervention.  The patient's history has been reviewed, patient examined, no change in status, stable for surgery.  I have reviewed the patient's chart and labs.  Questions were answered to the patient's satisfaction.     Lindsay Griffin

## 2021-08-17 ENCOUNTER — Encounter (HOSPITAL_BASED_OUTPATIENT_CLINIC_OR_DEPARTMENT_OTHER): Payer: Self-pay | Admitting: Orthopedic Surgery

## 2021-08-17 LAB — SURGICAL PATHOLOGY

## 2021-10-09 IMAGING — CR DG CHEST 2V
2 series · 2 of 2 positions shown · non-contrast
Comparison: 05/01/2019.

CLINICAL DATA: Abnormal left lower lobe breath sounds.

EXAM:
CHEST - 2 VIEW

[w chest pa]
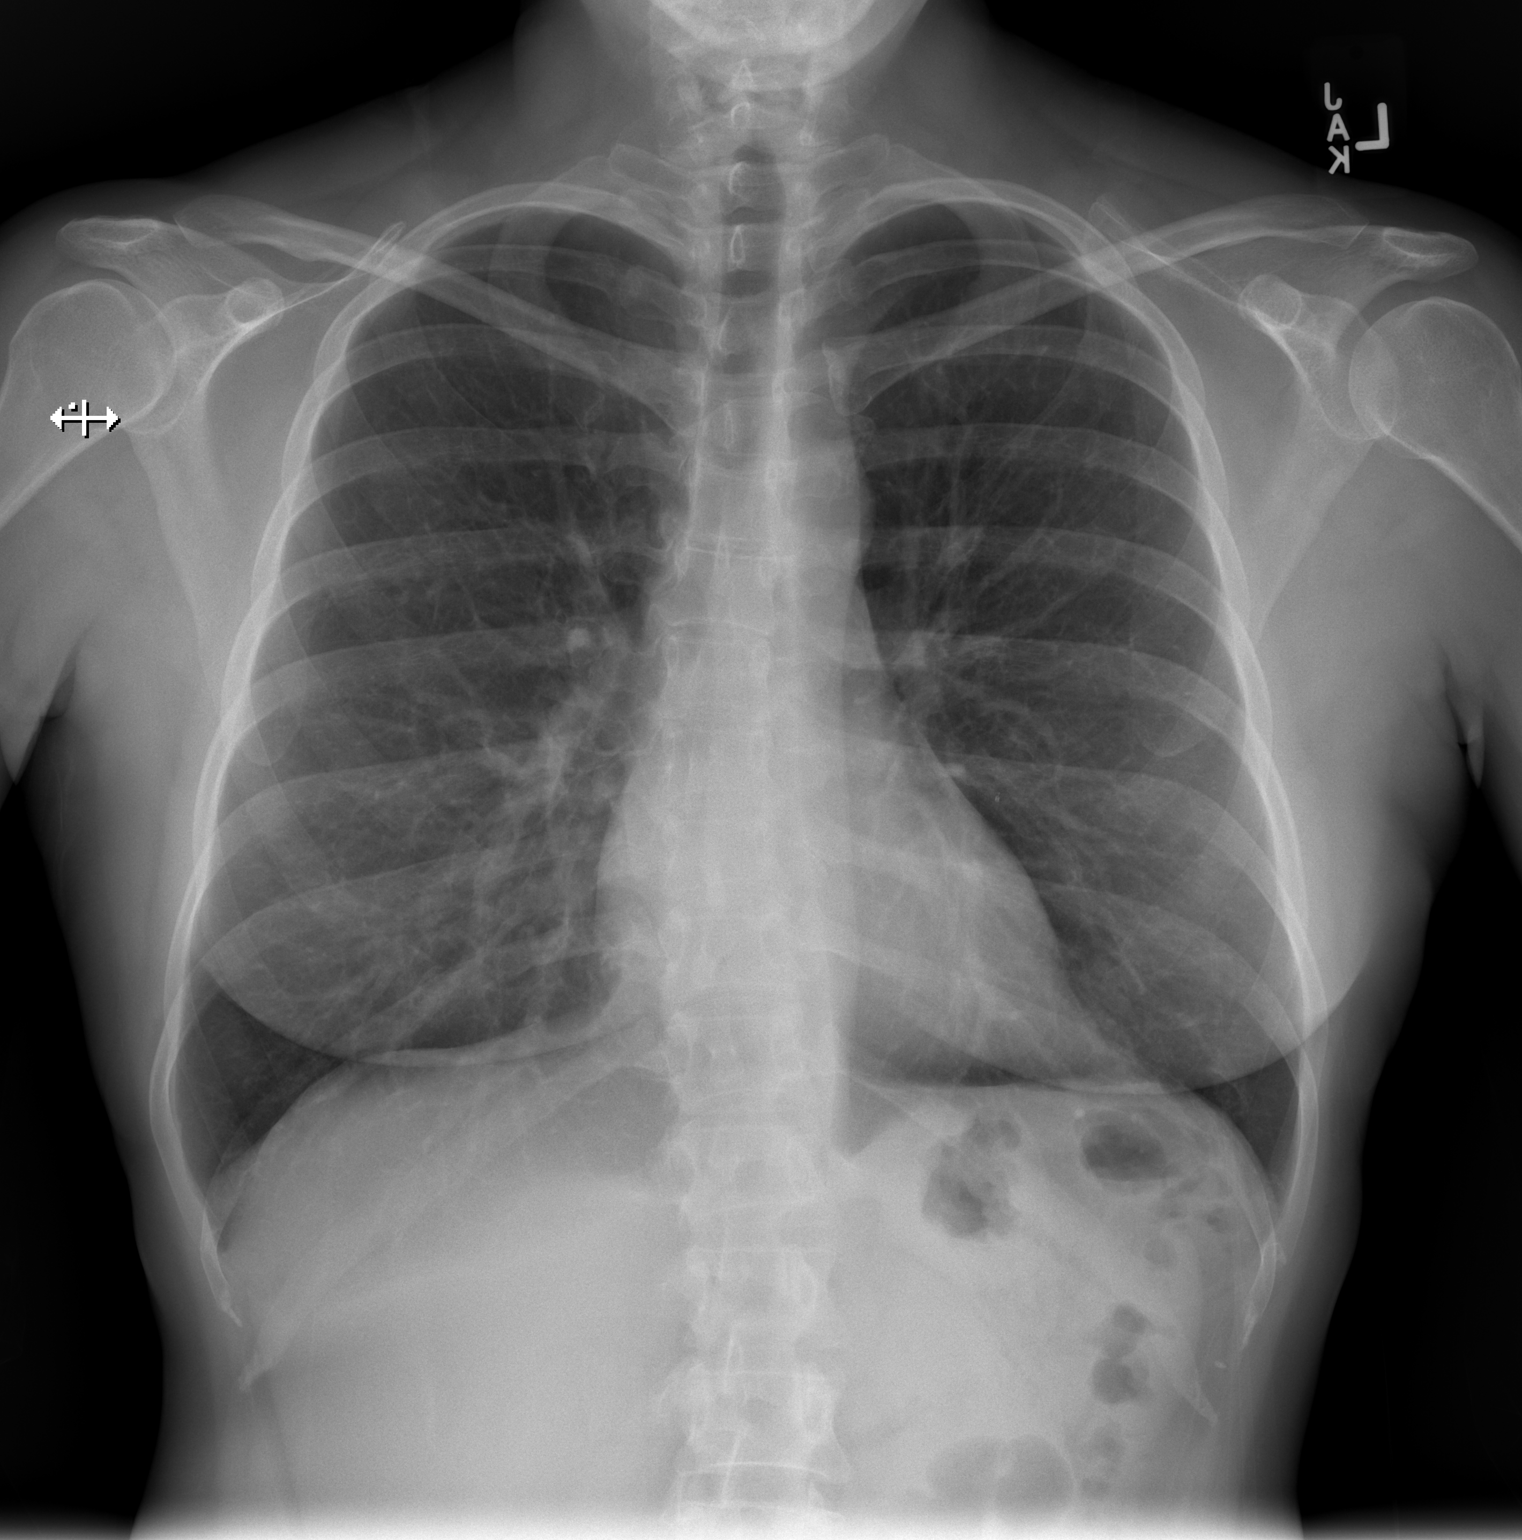

[w chest lat]
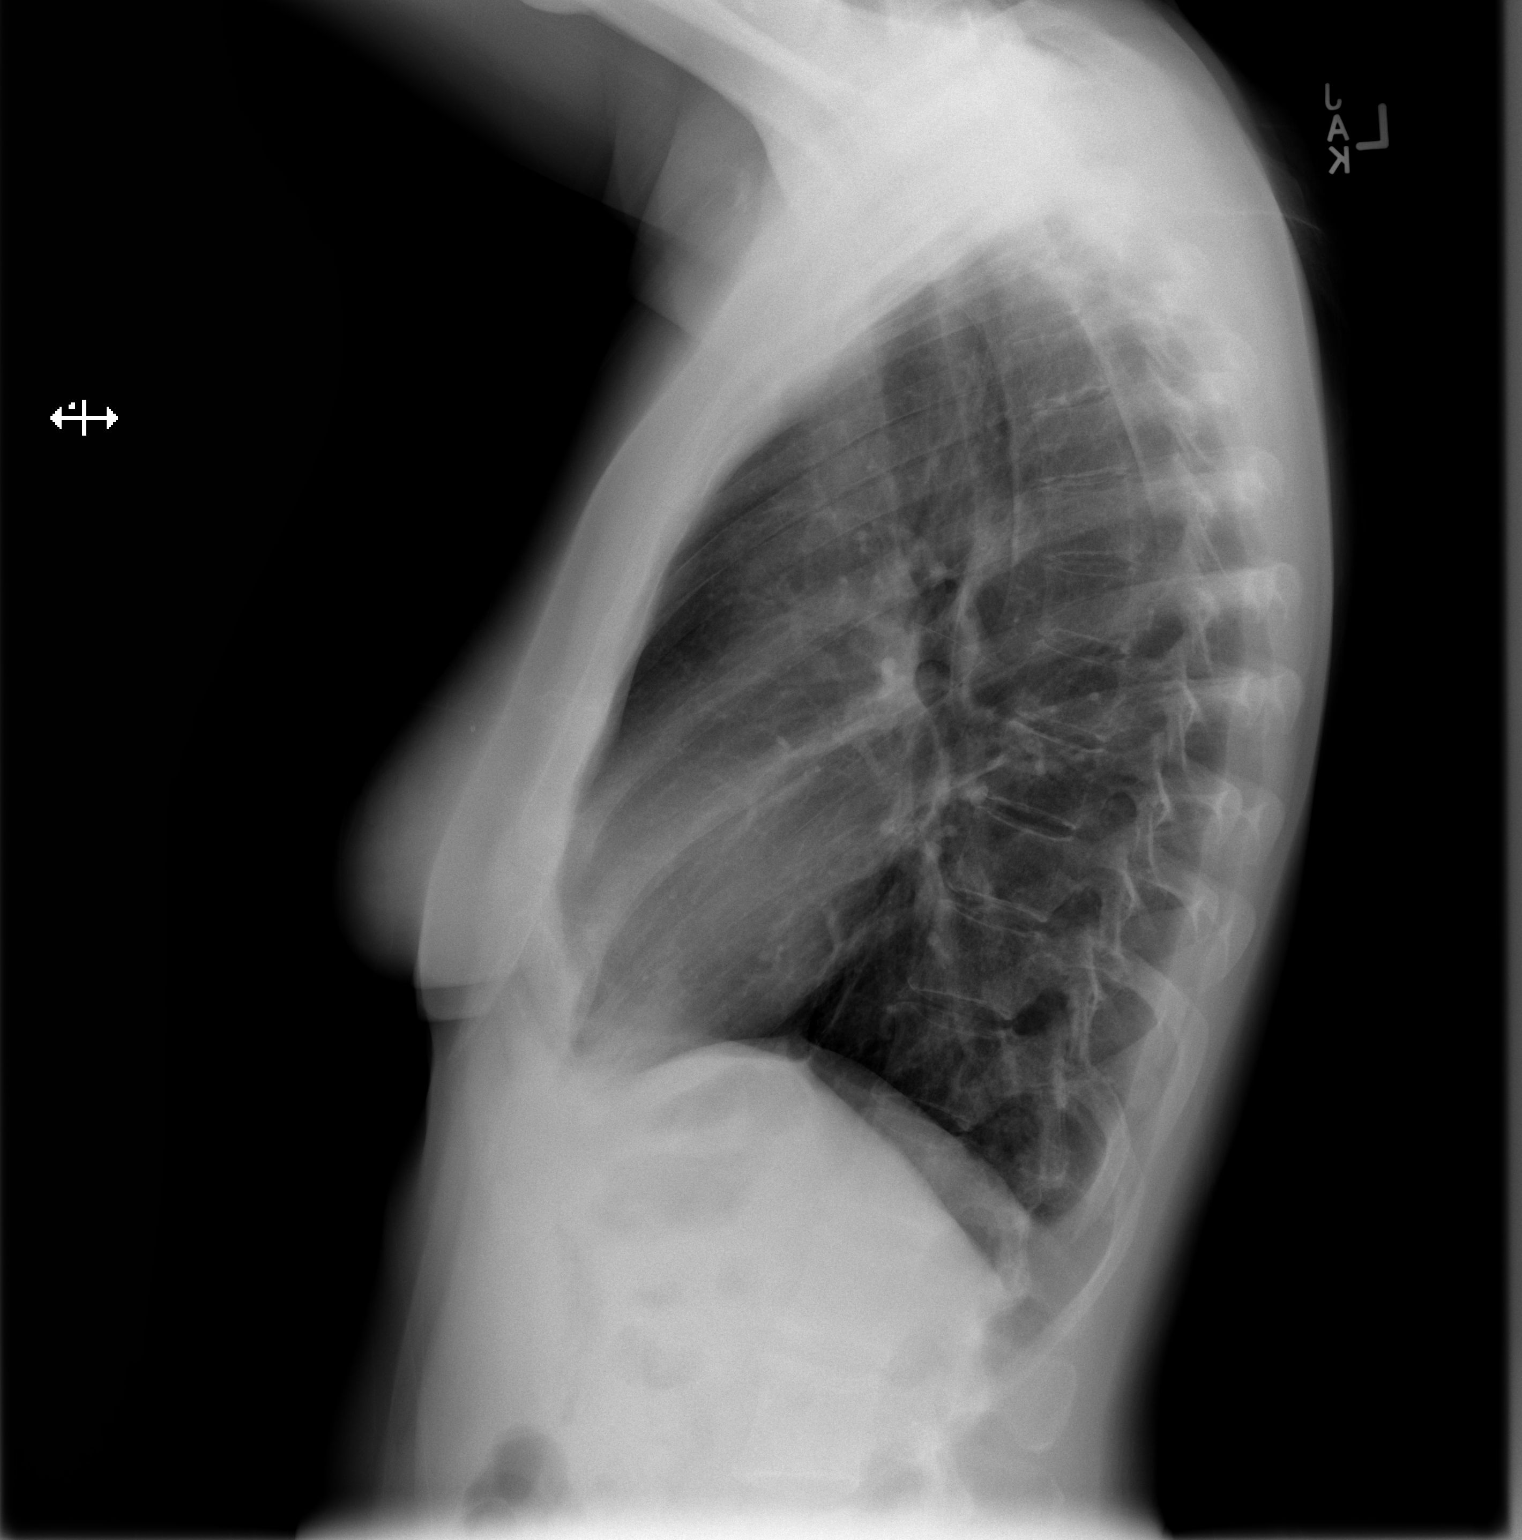

[2 of 2 positions shown; findings below may reference images not displayed]

FINDINGS: The heart size and mediastinal contours are normal. The lungs are
clear. There is no pleural effusion or pneumothorax. No acute
osseous findings are identified.
IMPRESSION: Stable chest.  No active cardiopulmonary process.

## 2022-01-08 ENCOUNTER — Encounter: Payer: 59 | Admitting: Family Medicine

## 2022-01-15 ENCOUNTER — Ambulatory Visit: Payer: 59 | Admitting: Physician Assistant

## 2022-01-15 ENCOUNTER — Encounter: Payer: Self-pay | Admitting: Physician Assistant

## 2022-01-15 VITALS — BP 130/80 | HR 94 | Ht 62.0 in | Wt 120.0 lb

## 2022-01-15 DIAGNOSIS — L900 Lichen sclerosus et atrophicus: Secondary | ICD-10-CM | POA: Insufficient documentation

## 2022-01-15 DIAGNOSIS — N904 Leukoplakia of vulva: Secondary | ICD-10-CM

## 2022-01-15 DIAGNOSIS — L4050 Arthropathic psoriasis, unspecified: Secondary | ICD-10-CM | POA: Diagnosis not present

## 2022-01-15 DIAGNOSIS — H04123 Dry eye syndrome of bilateral lacrimal glands: Secondary | ICD-10-CM | POA: Insufficient documentation

## 2022-01-15 DIAGNOSIS — Z6821 Body mass index (BMI) 21.0-21.9, adult: Secondary | ICD-10-CM | POA: Diagnosis not present

## 2022-01-15 DIAGNOSIS — E7800 Pure hypercholesterolemia, unspecified: Secondary | ICD-10-CM | POA: Diagnosis not present

## 2022-01-15 DIAGNOSIS — Z0000 Encounter for general adult medical examination without abnormal findings: Secondary | ICD-10-CM | POA: Diagnosis not present

## 2022-01-15 DIAGNOSIS — L400 Psoriasis vulgaris: Secondary | ICD-10-CM | POA: Insufficient documentation

## 2022-01-15 HISTORY — DX: Lichen sclerosus et atrophicus: L90.0

## 2022-01-15 HISTORY — DX: Body mass index (BMI) 21.0-21.9, adult: Z68.21

## 2022-01-15 HISTORY — DX: Leukoplakia of vulva: N90.4

## 2022-01-15 NOTE — Assessment & Plan Note (Signed)
Stable, will monitor 

## 2022-01-15 NOTE — Progress Notes (Signed)
? ?Complete physical exam ? ? ?Patient: Lindsay Griffin   DOB: 09/15/1976   46 y.o. Female  MRN: [id number] ?Visit Date: 01/15/2022 ? ?Chief Complaint  ?Patient presents with  ? Annual Exam  ?  Fasting CPE- no other concerns  ? ?Subjective  ?  ?Lindsay Griffin is a 46 y.o. female who presents today for a complete physical exam.  ? ?Reports is generally feeling well; is eating a somewhat healthy diet; is sleeping well 7 - 8; drinks 20 ounces of water a day; is not exercising due to joint pains, but stays active; states she had a "doubleear infection" 3 months ago and would like her ears checked; reports that she did finish all of the antibiotics prescribed and thinks her hearing is normal; declines a referral to ENT for further evaluation. ? ? ?HPI ?HPI   ? ? Annual Exam   ? Additional comments: Fasting CPE- no other concerns ? ?  ?  ?Last edited by Deforest Hoyles, [place] on 01/15/2022  9:41 AM.  ?  ?  ? ? ?Past Medical History:  ?Diagnosis Date  ? Abnormal Pap smear   ? Acute upper respiratory infections of unspecified site   ? AMA (advanced maternal age) multigravida 4+   ? Anemia   ? per pt, several years ago as of 08/13/21  ? Anxiety   ? Arthritis   ? Elevated LDL cholesterol level 12/30/2019  ? External hemorrhoids without mention of complication   ? Ganglion cyst 2022  ? right dorsal carpal ganglion cyst  ? H/O varicella   ? as a child  ? Lichen sclerosus   ? Mastodynia   ? Other specified visual disturbances   ? with pregnancy  ? Psoriasis   ? Psoriatic arthritis ([place])   ? manged by Dr. Pearline Cables  ? Retention of urine, unspecified   ? Rubella without mention of complication   ? unsure if measles or allergic reaction to sulfa  ? Scleritis 05/14/2021  ? right eye  ? ?Past Surgical History:  ?Procedure Laterality Date  ? BREAST BIOPSY Left 04/28/2017  ? COLONOSCOPY  10/03/2017  ? CRYOTHERAPY  1994  ? cryotherapy of vaginal lesion  ? DILATION AND CURETTAGE OF UTERUS  07/17/2010  ? FOOT SURGERY    ? R x2  ? GANGLION CYST  EXCISION Right 08/16/2021  ? Procedure: Right dorsal carpal ganglion cyst excision;  Surgeon: Orene Desanctis, MD;  Location: [facility];  Service: Orthopedics;  Laterality: Right;  with local anesthesia  ? HEMORRHOID BANDING  01/07/2018  ? LASIK    ? SEPTOPLASTY  2019  ? ?Social History  ? ?Socioeconomic History  ? Marital status: Married  ?  Spouse name: Not on file  ? Number of children: Not on file  ? Years of education: Not on file  ? Highest education level: Not on file  ?Occupational History  ? Not on file  ?Tobacco Use  ? Smoking status: Every Day  ?  Packs/day: 1.00  ?  Years: 27.00  ?  Pack years: 27.00  ?  Types: Cigarettes  ? Smokeless tobacco: Never  ?Vaping Use  ? Vaping Use: Never used  ?Substance and Sexual Activity  ? Alcohol use: Yes  ?  Alcohol/week: 6.0 - 8.0 standard drinks  ?  Types: 6 - 8 Cans of beer per week  ?  Comment: per day on weekends  ? Drug use: No  ? Sexual activity: Yes  ?Other Topics Concern  ?  Not on file  ?Social History Narrative  ? Not on file  ? ?Social Determinants of Health  ? ?Financial Resource Strain: Not on file  ?Food Insecurity: Not on file  ?Transportation Needs: Not on file  ?Physical Activity: Not on file  ?Stress: Not on file  ?Social Connections: Not on file  ?Intimate Partner Violence: Not on file  ? ?Family Status  ?Relation Name Status  ? Father  Deceased  ? Mother  Alive  ? MGM  (Not Specified)  ? MGF  (Not Specified)  ? PGF  (Not Specified)  ? Neg Hx  (Not Specified)  ? ?Family History  ?Problem Relation Age of Onset  ? Pancreatitis Father   ? Diabetes Mother   ? Cancer Mother   ?     cervical  ? Hypertension Mother   ? Colon polyps Mother   ? Stroke Maternal Grandmother   ? Heart disease Maternal Grandmother   ? Cancer Maternal Grandfather   ?     colon  ? Colon cancer Maternal Grandfather   ? Diabetes Paternal Grandfather   ? Breast cancer Neg Hx   ? ?Allergies  ?Allergen Reactions  ? Sulfa Antibiotics Rash and Hives  ?  As a child ?Occurred  in Early Childhood ?  ? ?  ?Patient Care Team: ?Marcellina Millin as PCP - General (Physician Assistant) ?Rheumatology, [facility] (Rheumatology) ?[facility], D[facility] (Dermatology) ?Brien Few, MD as Consulting Physician (Obstetrics and Gynecology) ?Orene Desanctis, MD as Consulting Physician (Orthopedic Surgery)  ? ?Medications: ?Outpatient Medications Prior to Visit  ?Medication Sig  ? fexofenadine (ALLEGRA) 180 MG tablet Take 180 mg by mouth daily. Pt unsure of dosage.  ? fluticasone (FLONASE) 50 MCG/ACT nasal spray Place into both nostrils daily.  ? golimumab (SIMPONI ARIA) 50 MG/4ML SOLN injection Inject into the vein. Every other month,last taken end of 05/2021.  ? ibuprofen (ADVIL) 800 MG tablet Take 800 mg by mouth every 8 (eight) hours as needed.  ? Olopatadine HCl (PATADAY OP) Apply to eye daily.  ? Polyethyl Glycol-Propyl Glycol (SYSTANE) 0.4-0.3 % GEL ophthalmic gel See admin instructions.  ? polyethylene glycol (MIRALAX / GLYCOLAX) 17 g packet Take 17 g by mouth daily.  ? sulfaSALAzine (AZULFIDINE) 500 MG tablet sulfasalazine 500 mg tablet ?  500 mg twice a day by oral route.  ? [DISCONTINUED] fluticasone (FLONASE) 50 MCG/ACT nasal spray 2 sprays.  ? etanercept (ENBREL SURECLICK) 50 MG/ML injection Enbrel SureClick 50 mg/mL (1 mL) subcutaneous pen injector  ? montelukast (SINGULAIR) 10 MG tablet Take 10 mg by mouth daily.  ? [DISCONTINUED] B Complex-C (B-COMPLEX WITH VITAMIN C) tablet Take 1 tablet by mouth daily. (Patient not taking: Reported on 01/15/2022)  ? [DISCONTINUED] diclofenac Sodium (VOLTAREN) 1 % GEL Apply topically as needed. (Patient not taking: Reported on 01/15/2022)  ? [DISCONTINUED] predniSONE (DELTASONE) 5 MG tablet Take by mouth. Just finished a pack of prednisone for an upper respiratory infection on 08/13/21. (Patient not taking: Reported on 08/13/2021)  ? [DISCONTINUED] Tapinarof (VTAMA) 1 % CREA Apply topically daily. (Patient not taking: Reported on 01/15/2022)  ? ?No  facility-administered medications prior to visit.  ? ? ?Review of Systems  ?Constitutional:  Negative for activity change and fever.  ?HENT:  Negative for congestion, ear pain and voice change.   ?Eyes:  Negative for redness.  ?Respiratory:  Negative for cough.   ?Cardiovascular:  Negative for chest pain.  ?Gastrointestinal:  Negative for constipation and diarrhea.  ?Endocrine: Negative for polyuria.  ?Genitourinary:  Negative for flank pain.  ?Musculoskeletal:  Negative for gait problem and neck stiffness.  ?Skin:  Negative for color change and rash.  ?Neurological:  Negative for dizziness.  ?Hematological:  Negative for adenopathy.  ?Psychiatric/Behavioral:  Negative for agitation, behavioral problems and confusion.   ? ?Last CBC ?Lab Results  ?Component Value Date  ? WBC 8.0 01/04/2021  ? HGB 14.8 01/04/2021  ? HCT 42.6 01/04/2021  ? MCV 98 (H) 01/04/2021  ? MCH 33.9 (H) 01/04/2021  ? RDW 12.4 01/04/2021  ? PLT 333 01/04/2021  ? ?Last metabolic panel ?Lab Results  ?Component Value Date  ? GLUCOSE 89 01/04/2021  ? NA 139 01/04/2021  ? K 4.5 01/04/2021  ? CL 100 01/04/2021  ? CO2 21 01/04/2021  ? BUN 10 01/04/2021  ? CREATININE 0.57 01/04/2021  ? EGFR 116 01/04/2021  ? CALCIUM 9.7 01/04/2021  ? PROT 7.0 01/04/2021  ? ALBUMIN 4.6 01/04/2021  ? LABGLOB 2.4 01/04/2021  ? AGRATIO 1.9 01/04/2021  ? BILITOT 0.8 01/04/2021  ? ALKPHOS 52 01/04/2021  ? AST 20 01/04/2021  ? ALT 12 01/04/2021  ? ANIONGAP 18 (H) 09/03/2014  ? ?Last lipids ?Lab Results  ?Component Value Date  ? CHOL 178 01/04/2021  ? HDL 74 01/04/2021  ? [place] 93 01/04/2021  ? TRIG 59 01/04/2021  ? CHOLHDL 2.4 01/04/2021  ? ?Last hemoglobin A1c ?Lab Results  ?Component Value Date  ? HGBA1C 5.2 01/04/2021  ? ?  ? ?The 10-year ASCVD risk score (Arnett DK, et al., 2019) is: 1.4% ? ? Objective  ?  ?BP 130/80   Pulse 94   Ht _0  (1.575 m)   Wt 120 lb (54.4 kg)   SpO2 95%   BMI 21.95 kg/m?  ? ?  ? ? ?Physical Exam ?Vitals and nursing note reviewed.   ?Constitutional:   ?   General: She is not in acute distress. ?   Appearance: Normal appearance. She is not ill-appearing.  ?HENT:  ?   Head: Normocephalic and atraumatic.  ?   Right Ear: Tympanic membrane, ear canal

## 2022-01-15 NOTE — Patient Instructions (Signed)

## 2022-01-15 NOTE — Assessment & Plan Note (Signed)
controlled, eat a low fat diet, increase fiber intake (Benefiber or Metamucil, Cherrios,  oatmeal, beans, nuts, fruits and vegetables), limit saturated fats (in fried foods, red meat), can add OTC fish oil supplement, eat fish with Omega-3 fatty acids like salmon and tuna, exercise for 30 minutes 3 - 5 times a week, drink 8 - 10 glasses of water a day ? ? ?

## 2022-01-15 NOTE — Assessment & Plan Note (Signed)
Stable, followed by Rheumatology ?

## 2022-01-16 LAB — LIPID PANEL
Chol/HDL Ratio: 3.2 ratio (ref 0.0–4.4)
Cholesterol, Total: 196 mg/dL (ref 100–199)
HDL: 61 mg/dL (ref >39)
LDL Chol Calc (NIH): 115 mg/dL — ABNORMAL HIGH (ref 0–99)
Triglycerides: 113 mg/dL (ref 0–149)
VLDL Cholesterol Cal: 20 mg/dL (ref 5–40)

## 2022-01-16 LAB — COMPREHENSIVE METABOLIC PANEL
ALT: 9 IU/L (ref 0–32)
AST: 16 IU/L (ref 0–40)
Albumin/Globulin Ratio: 1.8 (ref 1.2–2.2)
Albumin: 4.4 g/dL (ref 3.8–4.8)
Alkaline Phosphatase: 63 IU/L (ref 44–121)
BUN/Creatinine Ratio: 14 (ref 9–23)
BUN: 9 mg/dL (ref 6–24)
Bilirubin Total: 0.5 mg/dL (ref 0.0–1.2)
CO2: 23 mmol/L (ref 20–29)
Calcium: 9.6 mg/dL (ref 8.7–10.2)
Chloride: 103 mmol/L (ref 96–106)
Creatinine, Ser: 0.63 mg/dL (ref 0.57–1.00)
Globulin, Total: 2.5 g/dL (ref 1.5–4.5)
Glucose: 84 mg/dL (ref 70–99)
Potassium: 4.8 mmol/L (ref 3.5–5.2)
Sodium: 141 mmol/L (ref 134–144)
Total Protein: 6.9 g/dL (ref 6.0–8.5)
eGFR: 112 mL/min/{1.73_m2} (ref >59)

## 2022-01-16 LAB — CBC WITH DIFFERENTIAL/PLATELET
Basophils Absolute: 0 10*3/uL (ref 0.0–0.2)
Basos: 0 %
EOS (ABSOLUTE): 0.2 10*3/uL (ref 0.0–0.4)
Eos: 2 %
Hematocrit: 44.4 % (ref 34.0–46.6)
Hemoglobin: 15.1 g/dL (ref 11.1–15.9)
Immature Grans (Abs): 0 10*3/uL (ref 0.0–0.1)
Immature Granulocytes: 0 %
Lymphocytes Absolute: 2 10*3/uL (ref 0.7–3.1)
Lymphs: 24 %
MCH: 31.5 pg (ref 26.6–33.0)
MCHC: 34 g/dL (ref 31.5–35.7)
MCV: 93 fL (ref 79–97)
Monocytes Absolute: 0.7 10*3/uL (ref 0.1–0.9)
Monocytes: 9 %
Neutrophils Absolute: 5.5 10*3/uL (ref 1.4–7.0)
Neutrophils: 65 %
Platelets: 273 10*3/uL (ref 150–450)
RBC: 4.8 x10E6/uL (ref 3.77–5.28)
RDW: 12.5 % (ref 11.7–15.4)
WBC: 8.4 10*3/uL (ref 3.4–10.8)

## 2022-03-15 ENCOUNTER — Encounter: Payer: Self-pay | Admitting: Internal Medicine

## 2022-04-11 ENCOUNTER — Telehealth: Payer: 59 | Admitting: Family Medicine

## 2022-04-11 ENCOUNTER — Encounter: Payer: Self-pay | Admitting: Family Medicine

## 2022-04-11 VITALS — BP 125/80 | Ht 62.0 in | Wt 117.0 lb

## 2022-04-11 DIAGNOSIS — R509 Fever, unspecified: Secondary | ICD-10-CM | POA: Diagnosis not present

## 2022-04-11 DIAGNOSIS — J989 Respiratory disorder, unspecified: Secondary | ICD-10-CM | POA: Diagnosis not present

## 2022-04-11 DIAGNOSIS — F17200 Nicotine dependence, unspecified, uncomplicated: Secondary | ICD-10-CM | POA: Diagnosis not present

## 2022-04-11 DIAGNOSIS — R052 Subacute cough: Secondary | ICD-10-CM

## 2022-04-11 MED ORDER — HYDROCODONE BIT-HOMATROP MBR 5-1.5 MG/5ML PO SOLN: 5.0000 mL | Freq: Every evening | ORAL | 0 refills | Status: DC | PRN

## 2022-04-11 MED ORDER — AZITHROMYCIN 250 MG PO TABS: ORAL_TABLET | ORAL | 0 refills | Status: DC

## 2022-04-11 NOTE — Progress Notes (Signed)
Start time: 9:21 End time: 9:41  Virtual Visit via Video Note  I connected with Lindsay Griffin on 04/11/22 by a video enabled telemedicine application and verified that I am speaking with the correct person using two identifiers.  Location: Patient: at home Provider: office   I discussed the limitations of evaluation and management by telemedicine and the availability of in person appointments. The patient expressed understanding and agreed to proceed.  History of Present Illness:  Chief Complaint  Patient presents with   Cough    VIRTUAL cough, fever off and on. Chills and body aches. Symptoms started Sunday night and woke up Monday am feeling pretty bad. Negative covid tests yesterday and this am.    Sunday 7/22 had a sneezing fit.  She woke up Monday with a headache (not sinus), dry cough, and fever (subjective).   Denies runny nose, stuffiness.  L ear was plugged yesterday, improved today. She has some aching in both ears. Cough is getting worse. Nonproductive.  Getting winded coming up the stairs. She has had a slight cough for a few weeks.  +PND. Cough is worse at night.  Tessalon hasn't been effective in the past. +body aches since Monday.  Ibuprofen and Theraflu teas seem to help.  +sick contacts (URI's) at work. 5 yo granddaughter was sick 2 weeks ago (they were at the beach together).  She uses [facility] Flonase and Allegra daily  OTC med--just using daytime, which includes acetaminophen, DM, phenlephrine. She states that mucinex bothers her stomach (causes sharp pain).  Smoker, 1 PPD.  Reports getting bronchitis once a year.  Last sick in 10/2021, treated with augmentin for sinusitis and L otitis media by Urgent Care.  PMH, PSH, SH reviewed Psoriatic arthritis, allergies, depression  Outpatient Encounter Medications as of 04/11/2022  Medication Sig Note   azithromycin (ZITHROMAX) 250 MG tablet Take 2 tablets by mouth on first day, then 1 tablet by mouth on days  2 through 5    fexofenadine (ALLEGRA) 180 MG tablet Take 180 mg by mouth daily. Pt unsure of dosage.    fluticasone (FLONASE) 50 MCG/ACT nasal spray Place into both nostrils daily. 04/11/2022: [facility]   golimumab (SIMPONI ARIA) 50 MG/4ML SOLN injection Inject into the vein. Every other month,last taken end of 05/2021. 04/11/2022: Last infusion was 1.5 weeks ago, when she wasn't sick   HYDROcodone bit-homatropine (HYCODAN) 5-1.5 MG/5ML syrup Take 5 mLs by mouth at bedtime as needed for cough.    ibuprofen (ADVIL) 200 MG tablet Take 400 mg by mouth every 6 (six) hours as needed. 04/11/2022: Took '400mg'$  @ 7am   Phenylephrine-Pheniramine-DM (THERAFLU COLD & COUGH PO) Take 1 Package by mouth as needed. 04/11/2022: Last dose yesterday 4pm   polyethylene glycol (MIRALAX / GLYCOLAX) 17 g packet Take 17 g by mouth daily.    [DISCONTINUED] ibuprofen (ADVIL) 800 MG tablet Take 800 mg by mouth every 8 (eight) hours as needed.    Olopatadine HCl (PATADAY OP) Apply to eye daily. (Patient not taking: Reported on 04/11/2022) 04/11/2022: prn   [DISCONTINUED] etanercept (ENBREL SURECLICK) 50 MG/ML injection Enbrel SureClick 50 mg/mL (1 mL) subcutaneous pen injector    [DISCONTINUED] montelukast (SINGULAIR) 10 MG tablet Take 10 mg by mouth daily.    [DISCONTINUED] Polyethyl Glycol-Propyl Glycol (SYSTANE) 0.4-0.3 % GEL ophthalmic gel See admin instructions.    [DISCONTINUED] sulfaSALAzine (AZULFIDINE) 500 MG tablet sulfasalazine 500 mg tablet   500 mg twice a day by oral route.    No facility-administered encounter medications on  file as of 04/11/2022.    Allergies  Allergen Reactions   Sulfa Antibiotics Rash and Hives    As a child Occurred in Early Childhood   Has tolerated sulfasalzine    Observations/Objective:  BP 125/80   Ht '5\' 2"'$  (1.575 m)   Wt 117 lb (53.1 kg)   LMP 03/21/2022   BMI 21.40 kg/m   Well-appearing, somewhat tired, in no acute distress. Occ cough and throat clearing during  visit. Speaking comfortably, in no distress. Conjunctiva and sclera are clear, EOMI. Alert and oriented. Exam is limited due to virtual nature of the visit   Assessment and Plan:  Febrile respiratory illness - discussed viral vs bacterial. At risk for bacterial infection due to smoking.  Can hold off on ABX for 1-2d to see if improves (if viral) - Plan: azithromycin (ZITHROMAX) 250 MG tablet  Subacute cough - suspect related to smoking, allergies, and now with worsening due to acute illness - Plan: azithromycin (ZITHROMAX) 250 MG tablet, HYDROcodone bit-homatropine (HYCODAN) 5-1.5 MG/5ML syrup  Smoker - counseled, encouraged to quit  Zpak Hycodan just qHS (taking med siwth tylenol during the day) Stay well hydrated Continue OTC medications.  Fu in office if not improving  Follow Up Instructions:    I discussed the assessment and treatment plan with the patient. The patient was provided an opportunity to ask questions and all were answered. The patient agreed with the plan and demonstrated an understanding of the instructions.   The patient was advised to call back or seek an in-person evaluation if the symptoms worsen or if the condition fails to improve as anticipated.  I spent 25 minutes dedicated to the care of this patient, including pre-visit review of records, face to face time, post-visit ordering of testing and documentation.    Vikki Ports, MD

## 2022-04-11 NOTE — Patient Instructions (Addendum)
Stay well hydrated. Use the cough syrup just at bedtime to help with cough (you are taking other medications during the day that contain acetaminophen, so you don't want to take them together; also because the cough syrup is sedating). Continue the over-the-counter medications you are taking (theraflu daytime).  Take the z-pak as directed. Contact us if symptoms persist or worsen--we likely will want to examine you in the office if having worsening cough, shortness of breath, persistent fever.  Remember that the z-pak lasts for 10 days, even if you only take the pills for 5 days.  If you are somewhat better by 5 days, be patient.  Please try and quit smoking.  Smoking increases the risk for bacterial infections (as either complications from allergies, or from what may start out as viral infections).

## 2022-05-22 ENCOUNTER — Encounter: Payer: Self-pay | Admitting: Internal Medicine

## 2022-06-25 ENCOUNTER — Encounter: Payer: Self-pay | Admitting: Internal Medicine

## 2022-07-30 ENCOUNTER — Encounter: Payer: Self-pay | Admitting: Internal Medicine

## 2022-12-06 ENCOUNTER — Encounter: Payer: Self-pay | Admitting: Nurse Practitioner

## 2022-12-06 ENCOUNTER — Telehealth: Payer: 59 | Admitting: Nurse Practitioner

## 2022-12-06 VITALS — Wt 117.0 lb

## 2022-12-06 DIAGNOSIS — J0140 Acute pansinusitis, unspecified: Secondary | ICD-10-CM

## 2022-12-06 MED ORDER — HYDROCOD POLI-CHLORPHE POLI ER 10-8 MG/5ML PO SUER: 5.0000 mL | Freq: Two times a day (BID) | ORAL | 0 refills | Status: DC

## 2022-12-06 MED ORDER — AMOXICILLIN-POT CLAVULANATE 875-125 MG PO TABS: 1.0000 | ORAL_TABLET | Freq: Two times a day (BID) | ORAL | 0 refills | Status: DC

## 2022-12-20 DIAGNOSIS — J0140 Acute pansinusitis, unspecified: Secondary | ICD-10-CM

## 2022-12-20 HISTORY — DX: Acute pansinusitis, unspecified: J01.40

## 2022-12-20 NOTE — Assessment & Plan Note (Signed)
The patient presents with symptoms including facial pain, pressure, green mucus, and cough persisting for 8 days, which are indicative of a sinus infection. She has a background of allergies and bronchitis, which appear to be exacerbated at this time. Given the length of time she has been experiencing symptoms, I feel that bacterial infection is likely present. She does have an upcoming trip to [place] which adds to her concerns.  Plan: - Prescribe Tessalon Perles for daytime cough suppression and a codeine/hydrocodone cough syrup for nighttime relief. - Continue Sudafed during the day to alleviate congestion. - Initiate antibiotic therapy to address the infection. - Suggest taking cetirizine at night to manage allergy symptoms without disrupting daytime activities. - Observe the patient's condition and contemplate a steroid burst if there is no substantial improvement in symptoms before the trip. - Advise against traveling if severe symptoms persist.

## 2022-12-20 NOTE — Progress Notes (Signed)
Virtual Visit Encounter mychart visit.   I connected with  Lindsay Griffin on 12/20/22 at  3:15 PM EDT by secure video and audio telemedicine application. I verified that I am speaking with the correct person using two identifiers.   I introduced myself as a Publishing rights managerurse Practitioner with the practice. The limitations of evaluation and management by telemedicine discussed with the patient and the availability of in person appointments. The patient expressed verbal understanding and consent to proceed.  Participating parties in this visit include: Myself and patient  The patient is: Patient Location: Home I am: Provider Location: Office/Clinic Subjective:    CC and HPI: Lindsay Griffin is a 47 y.o. year old female presenting for new evaluation and treatment of sinus symptoms.  Lindsay Griffin presents today with an eight-day history of symptoms indicative of sinusitis. She reports experiencing facial pressure and pain, including in the nose, head, cheekbones, and teeth. Additionally, Lindsay Griffin describes a significant discharge of green mucus and a persistent cough, which she is concerned may develop into bronchitis.  She has a known history of allergies and is currently taking Zyrtec-D for management, although she limits cetirizine to nighttime use due to side effects. Lindsay Griffin has attempted relief with Alka-Seltzer cold and cough, but experienced drowsiness, and has been using Robitussin cough medication.  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 She reports no breathing difficulties and is not currently on any decongestants, though she has 12-hour Sudafed available at home. Lindsay Griffin has a physical examination scheduled for Theotis Gerdeman May.  Past medical history, Surgical history, Family history not pertinant except as noted below, Social history, Allergies, and medications have been entered into the medical record,  reviewed, and corrections made.   Review of Systems:  All review of systems negative except what is listed in the HPI  Objective:    Alert and oriented x 4 Speaking in clear sentences with no shortness of breath. No distress.  Impression and Recommendations:    Problem List Items Addressed This Visit     Acute non-recurrent pansinusitis - Primary    The patient presents with symptoms including facial pain, pressure, green mucus, and cough persisting for 8 days, which are indicative of a sinus infection. She has a background of allergies and bronchitis, which appear to be exacerbated at this time. Given the length of time she has been experiencing symptoms, I feel that bacterial infection is likely present. She does have an upcoming trip to [place] which adds to her concerns.  Plan: - Prescribe Tessalon Perles for daytime cough suppression and a codeine/hydrocodone cough syrup for nighttime relief. - Continue Sudafed during the day to alleviate congestion. - Initiate antibiotic therapy to address the infection. - Suggest taking cetirizine at night to manage allergy symptoms without disrupting daytime activities. - Observe the patient's condition and contemplate a steroid burst if there is no substantial improvement in symptoms before the trip. - Advise against traveling if severe symptoms persist.      Relevant Medications   amoxicillin-clavulanate (AUGMENTIN) 875-125 MG tablet   chlorpheniramine-HYDROcodone (TUSSIONEX) 10-8 MG/5ML    orders and follow up as documented in EMR I discussed the assessment and treatment plan with the patient. The patient was provided an opportunity to ask questions and all were answered. The patient agreed with the plan and demonstrated an understanding of the instructions.   The patient was advised to call back or seek an in-person evaluation if  the symptoms worsen or if the condition fails to improve as anticipated.  Follow-Up: prn  I provided  16 minutes of non-face-to-face interaction with this non face-to-face encounter including intake, same-day documentation, and chart review.   Tollie Eth, NP , DNP, AGNP-c  [facility]

## 2022-12-25 ENCOUNTER — Encounter: Payer: Self-pay | Admitting: Family Medicine

## 2022-12-25 ENCOUNTER — Telehealth: Payer: 59 | Admitting: Family Medicine

## 2022-12-25 DIAGNOSIS — B3731 Acute candidiasis of vulva and vagina: Secondary | ICD-10-CM

## 2022-12-25 DIAGNOSIS — J309 Allergic rhinitis, unspecified: Secondary | ICD-10-CM

## 2022-12-25 MED ORDER — FLUCONAZOLE 150 MG PO TABS: ORAL_TABLET | ORAL | 0 refills | Status: DC

## 2022-12-25 NOTE — Patient Instructions (Addendum)
I suspect a yeast infection based on your prior history, your typical symptoms, and recent antibiotic use. Start the diflucan today.  If not completely better in a week, you can repeat it (I'm prescribing 2 pills, to be used a week apart, if needed). If you aren't responding to the diflucan at all, try the Monistat, and if ongoing symptoms, schedule a visit for an in-person evaluation (or see your gynecologist).  You may use some benadryl at bedtime if needed (helps with itching and helps with sleep).  Consider using a 12 hour Mucinex (plain or DM) if needed for cough. You can safely take this with your other medications.

## 2022-12-25 NOTE — Progress Notes (Signed)
Start time: 1:30 End time:  1:45  Virtual Visit via Video Note  I connected with Lindsay Griffin on 12/25/22 by a video enabled telemedicine application and verified that I am speaking with the correct person using two identifiers.  Location: Patient: home Provider: office   I discussed the limitations of evaluation and management by telemedicine and the availability of in person appointments. The patient expressed understanding and agreed to proceed.  History of Present Illness:  Chief Complaint  Patient presents with   Vaginitis    TELEPHONE vaginal itching and discharge started last night. Finished a course of about a week ago.   DONE BY VIDEO, WITH TELEPHONE FOR AUDIO--WAS A VIDEO VISIT  Patient complains of vaginal itching.  She was treated with augmentin a few weeks ago for a sinus infection (virtual visit 3/22). She completed the antibiotic about a week ago.  She has persistent slight cough.  Vaginal itching and discharge started last night.  The itching was so bad last night she couldn't sleep. Discharge is white, but has a yellowish ting, not particularly thick. Denies any odor, not similar to BV. Feels similar to prior yeast infections. Diflucan has been effective in the past.  Taking some robitussin here and there for the cough since her illness.   She has year-round allergies.  Patient reports she is not at risk for STDs  PMH, PSH, SH reviewed H/o depression, h/o overdose, RA, psoriatic arthritis and plaque psoriasis, smoker  Outpatient Encounter Medications as of 12/25/2022  Medication Sig Note   fexofenadine (ALLEGRA) 180 MG tablet Take 180 mg by mouth daily. Pt unsure of dosage.    fluconazole (DIFLUCAN) 150 MG tablet Take 1 tablet by mouth for yeast infection.  Repeat in 1 week if needed    fluticasone (FLONASE) 50 MCG/ACT nasal spray Place into both nostrils daily. 04/11/2022: [facility]   golimumab (SIMPONI ARIA) 50 MG/4ML SOLN injection Inject into the  vein. Every other month,last taken end of 05/2021. 12/25/2022: Last infusion beginning of March   polyethylene glycol (MIRALAX / GLYCOLAX) 17 g packet Take 17 g by mouth daily. 12/25/2022: Daily    amoxicillin-clavulanate (AUGMENTIN) 875-125 MG tablet Take 1 tablet by mouth 2 (two) times daily. (Patient not taking: Reported on 12/25/2022)    diclofenac (VOLTAREN) 75 MG EC tablet Take 75 mg by mouth 2 (two) times daily. (Patient not taking: Reported on 12/25/2022) 12/25/2022: As needed   ibuprofen (ADVIL) 200 MG tablet Take 400 mg by mouth every 6 (six) hours as needed. (Patient not taking: Reported on 12/25/2022) 12/25/2022: As needed   Olopatadine HCl (PATADAY OP) Apply to eye daily. (Patient not taking: Reported on 04/11/2022) 12/25/2022: As needed   [DISCONTINUED] azithromycin (ZITHROMAX) 250 MG tablet Take 2 tablets by mouth on first day, then 1 tablet by mouth on days 2 through 5 (Patient not taking: Reported on 12/06/2022)    [DISCONTINUED] chlorpheniramine-HYDROcodone (TUSSIONEX) 10-8 MG/5ML Take 5 mLs by mouth 2 (two) times daily.    [DISCONTINUED] HYDROcodone bit-homatropine (HYCODAN) 5-1.5 MG/5ML syrup Take 5 mLs by mouth at bedtime as needed for cough. (Patient not taking: Reported on 12/06/2022)    [DISCONTINUED] Phenylephrine-Pheniramine-DM (THERAFLU COLD & COUGH PO) Take 1 Package by mouth as needed. 04/11/2022: Last dose yesterday 4pm   No facility-administered encounter medications on file as of 12/25/2022.   Allergies  Allergen Reactions   Sulfa Antibiotics Rash and Hives    As a child Occurred in Early Childhood      Observations/Objective:  LMP  12/11/2022 Comment: for one day-spotting  Pleasant female, in no distress She is alert and oriented. Cranial nerves grossly normal, normal eye contact, speech. Exam is limited due to the virtual nature of the visit.  Assessment and Plan:  Yeast vaginitis - Plan: fluconazole (DIFLUCAN) 150 MG tablet  Allergic rhinitis, unspecified  seasonality, unspecified trigger - cont current meds. Can you Mucinex (plain or DM) to help with cough, likely from PND  I suspect a yeast infection based on your prior history, your typical symptoms, and recent antibiotic use. Start the diflucan today.  If not completely better in a week, you can repeat it (I'm prescribing 2 pills, to be used a week apart, if needed). If you aren't responding to the diflucan at all, try the Monistat, and if ongoing symptoms, schedule a visit for an in-person evaluation (or see your gynecologist).  Consider using a 12 hour Mucinex (plain or DM) if needed for cough. You can safely take this with your other medications.  She has GYN appt 4/30. Last CPE 01/2022, scheduled with Minna MerrittsSarabeth 01/2023; overdue for TdaP   Follow Up Instructions:    I discussed the assessment and treatment plan with the patient. The patient was provided an opportunity to ask questions and all were answered. The patient agreed with the plan and demonstrated an understanding of the instructions.   The patient was advised to call back or seek an in-person evaluation if the symptoms worsen or if the condition fails to improve as anticipated.    Lavonda JumboEve A Nani Ingram, MD

## 2023-01-20 ENCOUNTER — Encounter: Payer: 59 | Admitting: Physician Assistant

## 2023-01-23 ENCOUNTER — Other Ambulatory Visit (INDEPENDENT_AMBULATORY_CARE_PROVIDER_SITE_OTHER): Payer: 59

## 2023-01-23 ENCOUNTER — Encounter: Payer: Self-pay | Admitting: Nurse Practitioner

## 2023-01-23 ENCOUNTER — Ambulatory Visit (INDEPENDENT_AMBULATORY_CARE_PROVIDER_SITE_OTHER): Payer: 59 | Admitting: Nurse Practitioner

## 2023-01-23 DIAGNOSIS — R6889 Other general symptoms and signs: Secondary | ICD-10-CM

## 2023-01-23 DIAGNOSIS — J029 Acute pharyngitis, unspecified: Secondary | ICD-10-CM | POA: Diagnosis not present

## 2023-01-23 DIAGNOSIS — R52 Pain, unspecified: Secondary | ICD-10-CM

## 2023-01-23 HISTORY — DX: Other general symptoms and signs: R68.89

## 2023-01-23 LAB — POCT INFLUENZA A/B
Influenza A, POC: NEGATIVE
Influenza B, POC: NEGATIVE

## 2023-01-23 LAB — POC COVID19 BINAXNOW: SARS Coronavirus 2 Ag: NEGATIVE

## 2023-01-23 LAB — POCT RAPID STREP A (OFFICE): Rapid Strep A Screen: NEGATIVE

## 2023-01-23 NOTE — Patient Instructions (Signed)
Problem List Items Addressed This Visit     Flu-like symptoms - Primary    Plan: -I recommend that you stay home for at least 24 hours after your fever has broke without the use of Tylenol or ibuprofen -Be sure you are drinking plenty of water particularly while you have a fever as this can significantly cause dehydration -Rest is much as possible to allow your body to heal -Take ibuprofen 600 to 800 mg alternating with Tylenol 1000 mg every 4-5 hours to keep this in your system around-the-clock and help with the headache, body aches, and fever. - If you are positive for strep throat, I will send antibiotics to the pharmacy for you.  - If you are positive for the flu, I will send in Tamiflu (antiviral therapy) for you.

## 2023-01-23 NOTE — Assessment & Plan Note (Signed)
Sore throat, fever, chills, body aches, headache, congestion, and weakness all with sudden onset yesterday. No clear etiology of the cause. Differentials include flu, COVID, strep, or viral infection.  Given the length of time she has had symptoms antibiotic therapy would not be warranted unless this is found to be strep throat.  Discussed with the patient the option of testing and she is in agreement to come in for testing for flu and strep throat.  If these are both negative we will plan to treat conservatively with rest, hydration, and alternating ibuprofen and Tylenol every 4 hours.  If tests are positive we will send appropriate treatment. Plan: -I recommend that you stay home for at least 24 hours after your fever has broke without the use of Tylenol or ibuprofen -Be sure you are drinking plenty of water particularly while you have a fever as this can significantly cause dehydration -Rest is much as possible to allow your body to heal -Take ibuprofen 600 to 800 mg alternating with Tylenol 1000 mg every 4-5 hours to keep this in your system around-the-clock and help with the headache, body aches, and fever. - If you are positive for strep throat, I will send antibiotics to the pharmacy for you.  - If you are positive for the flu, I will send in Tamiflu (antiviral therapy) for you.

## 2023-01-23 NOTE — Progress Notes (Addendum)
Virtual Visit Encounter telephone visit.   I connected with  Lindsay Griffin on 01/23/23 at 10:30 AM EDT by secure audio telemedicine application. I verified that I am speaking with the correct person using two identifiers.   I introduced myself as a Publishing rights manager with the practice. The limitations of evaluation and management by telemedicine discussed with the patient and the availability of in person appointments. The patient expressed verbal understanding and consent to proceed.  Participating parties in this visit include: Myself and patient  The patient is: Patient Location: Home I am: Provider Location: Office/Clinic Subjective:    CC and HPI: Lindsay Griffin is a 47 y.o. year old female presenting for new evaluation and treatment of flu-like symptoms. Patient reports the following:  Lindsay Griffin tells me on Tuesday she woke up with a sore throat, but didn't have any other symptoms.Yesterday in the morning she suddenly developed a headache, chills, body aches, and fever along with the ongoing sore throat. She took ibuprofen with no effect on the headache. She later took a sudafed and that helped. She did have her fever break in the afternoon and her body aches improved some. She tells me she was unable to sleep last night due to the headache and body aches. She reports feeling generally ill and weak with the symptoms continuing. She did take a home COVID test and this was negative.   Past medical history, Surgical history, Family history not pertinant except as noted below, Social history, Allergies, and medications have been entered into the medical record, reviewed, and corrections made.   Review of Systems:  All review of systems negative except what is listed in the HPI  Objective:    Alert and oriented x 4 Sounds congested Speaking in clear sentences with no shortness of breath. No distress.  Impression and Recommendations:    Problem List Items Addressed This Visit      Flu-like symptoms - Primary    Sore throat, fever, chills, body aches, headache, congestion, and weakness all with sudden onset yesterday. No clear etiology of the cause. Differentials include flu, COVID, strep, or viral infection.  Given the length of time she has had symptoms antibiotic therapy would not be warranted unless this is found to be strep throat.  Discussed with the patient the option of testing and she is in agreement to come in for testing for flu and strep throat.  If these are both negative we will plan to treat conservatively with rest, hydration, and alternating ibuprofen and Tylenol every 4 hours.  If tests are positive we will send appropriate treatment. Plan: -I recommend that you stay home for at least 24 hours after your fever has broke without the use of Tylenol or ibuprofen -Be sure you are drinking plenty of water particularly while you have a fever as this can significantly cause dehydration -Rest is much as possible to allow your body to heal -Take ibuprofen 600 to 800 mg alternating with Tylenol 1000 mg every 4-5 hours to keep this in your system around-the-clock and help with the headache, body aches, and fever. - If you are positive for strep throat, I will send antibiotics to the pharmacy for you.  - If you are positive for the flu, I will send in Tamiflu (antiviral therapy) for you.        Addendum: Flu, COVID, and strep tests negative. Suspect this is viral etiology given that it has only been present for 2 days. Recommend rest, hydration, and over  the counter therapies mentioned above to help manage symptoms.   orders and follow up as documented in EMR I discussed the assessment and treatment plan with the patient. The patient was provided an opportunity to ask questions and all were answered. The patient agreed with the plan and demonstrated an understanding of the instructions.   The patient was advised to call back or seek an in-person evaluation if the  symptoms worsen or if the condition fails to improve as anticipated.  Follow-Up: prn  I provided 22 minutes of non-face-to-face interaction with this non face-to-face encounter including intake, same-day documentation, and chart review.   Tollie Eth, NP , DNP, AGNP-c [facility]

## 2023-01-27 ENCOUNTER — Ambulatory Visit: Payer: 59 | Admitting: Nurse Practitioner

## 2023-01-27 ENCOUNTER — Encounter: Payer: Self-pay | Admitting: Nurse Practitioner

## 2023-01-27 VITALS — BP 120/70 | HR 115 | Temp 99.0°F | Wt 112.8 lb

## 2023-01-27 DIAGNOSIS — R110 Nausea: Secondary | ICD-10-CM | POA: Diagnosis not present

## 2023-01-27 DIAGNOSIS — R051 Acute cough: Secondary | ICD-10-CM | POA: Diagnosis not present

## 2023-01-27 DIAGNOSIS — R5081 Fever presenting with conditions classified elsewhere: Secondary | ICD-10-CM

## 2023-01-27 DIAGNOSIS — H66002 Acute suppurative otitis media without spontaneous rupture of ear drum, left ear: Secondary | ICD-10-CM | POA: Diagnosis not present

## 2023-01-27 MED ORDER — FLUCONAZOLE 150 MG PO TABS
150.0000 mg | ORAL_TABLET | Freq: Once | ORAL | 2 refills | Status: AC
Start: 2023-01-27 — End: 2023-01-27

## 2023-01-27 MED ORDER — AZITHROMYCIN 250 MG PO TABS
ORAL_TABLET | ORAL | 0 refills | Status: AC
Start: 2023-01-27 — End: 2023-02-01

## 2023-01-27 MED ORDER — AMOXICILLIN-POT CLAVULANATE 875-125 MG PO TABS
1.0000 | ORAL_TABLET | Freq: Two times a day (BID) | ORAL | 0 refills | Status: DC
Start: 2023-01-27 — End: 2023-02-15

## 2023-01-27 MED ORDER — ONDANSETRON HCL 4 MG PO TABS: 4.0000 mg | ORAL_TABLET | Freq: Three times a day (TID) | ORAL | 1 refills | Status: DC | PRN

## 2023-01-27 NOTE — Progress Notes (Signed)
Tollie Eth, DNP, AGNP-c [facility] [address] [place], [place] [address] [phone]  Subjective:   Lindsay Griffin is a 47 y.o. female presents to day for evaluation of: Lindsay Griffin presents today with chief complaints of body aches, neck aches, weak fever, and cough, which began several days ago. She initially experienced a sore throat, but this symptom has resolved. Her current symptoms primarily include body aches, headaches in the neck and back, and a persistent cough.  Lindsay Griffin has a history of frequent bronchitis and sinusitis. She recalls a recent virtual consultation in March when she was ill following a trip to [place]. She notes that she has been sick twice recently, both times after caring for her granddaughter, Lindsay Griffin, who attends daycare but has not been ill. Lindsay Griffin is currently on biologics, which weaken her immune system.  She reports a recent unintentional weight loss of 5 pounds and a decrease in appetite. Lindsay Griffin also experiences sharp pains in her ears, identifying a previous double ear infection, and requests an ear examination. She denies alcohol consumption and tries to maintain hydration. Lindsay Griffin describes feeling generally weak and wanting to rest extensively.  Lindsay Griffin has been managing her fever with Tylenol and notes that Sudafed causes her heart to race. She also mentions a painful sensation in her ribs, exacerbated by coughing, and difficulty sleeping due to shoulder pain, possibly caused by her dogs. 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  PMH, Medications, and Allergies reviewed and updated in chart as appropriate.   ROS negative except for what is listed in HPI. Objective:  BP 120/70   Pulse (!) 115   Temp 99 F (37.2 C)   Wt 112 lb 12.8 oz (51.2 kg)   SpO2 96%   BMI 20.63 kg/m  Physical Exam Vitals and nursing note reviewed.   Constitutional:      Appearance: She is ill-appearing.  HENT:     Head: Normocephalic.     Right Ear: Hearing normal.     Left Ear: Hearing normal. A middle ear effusion is present.  Neck:     Vascular: No carotid bruit.  Cardiovascular:     Rate and Rhythm: Regular rhythm. Tachycardia present.     Pulses: Normal pulses.     Heart sounds: Normal heart sounds.  Pulmonary:     Breath sounds: Rhonchi and rales present.     Comments: Right sided Skin:    General: Skin is warm and dry.     Capillary Refill: Capillary refill takes less than 2 seconds.  Neurological:     General: No focal deficit present.     Mental Status: She is alert.  Psychiatric:        Mood and Affect: Mood normal.           Assessment & Plan:   Problem List Items Addressed This Visit     Acute cough - Primary    Assessment: Lindsay Griffin presents with body aches, neck pain, low-grade fever, and a productive cough. Physical examination revealed rhonchi in the right lung and rib pain, likely exacerbated by coughing, raising concerns for community-acquired pneumonia.  Plan: - Prescribe Azithromycin and Augmentin to target typical and atypical bacterial pathogens - Advise continued use of Tylenol and ibuprofen for fever and pain management, alternating as previously instructed - Emphasize the importance of staying hydrated - Instruct patient to contact the office if unable to tolerate fluids or if  symptoms worsen      Relevant Medications   amoxicillin-clavulanate (AUGMENTIN) 875-125 MG tablet   ondansetron (ZOFRAN) 4 MG tablet   Fever    Lindsay Griffin is on biologics which may compromise her immune system, contributing to her recurrent infections. Plan: - Suggest supplements including vitamin D, vitamin C, and zinc to potentially aid in boosting the immune system, though effectiveness may be limited due to the biologics - Continue close monitoring of her health, especially during periods of acute illness       Relevant Medications   amoxicillin-clavulanate (AUGMENTIN) 875-125 MG tablet   ondansetron (ZOFRAN) 4 MG tablet   Non-recurrent acute suppurative otitis media of left ear without spontaneous rupture of tympanic membrane    Assessment: Examination revealed fluid in one ear without infection and confirmed infection in the left ear.  Plan: - The prescribed antibiotics (Azithromycin and Augmentin) should also address the ear infection - Monitor symptoms and follow up if no improvement is observed       Relevant Medications   amoxicillin-clavulanate (AUGMENTIN) 875-125 MG tablet   Other Visit Diagnoses     Nausea       Relevant Medications   amoxicillin-clavulanate (AUGMENTIN) 875-125 MG tablet   ondansetron (ZOFRAN) 4 MG tablet         Tollie Eth, DNP, AGNP-c 02/12/2023  6:54 PM    History, Medications, Surgery, SDOH, and Family History reviewed and updated as appropriate.

## 2023-01-27 NOTE — Patient Instructions (Addendum)
I want you to rest as much as possible and stay well hydrated.   Keep alternating the tylenol and ibuprofen.   If you get can't hold liquid down, please let me know.   Increase your vitamin D, vitamin C, and zinc to help build the immune system.

## 2023-01-28 ENCOUNTER — Ambulatory Visit: Payer: 59 | Admitting: Nurse Practitioner

## 2023-02-12 DIAGNOSIS — H66002 Acute suppurative otitis media without spontaneous rupture of ear drum, left ear: Secondary | ICD-10-CM | POA: Insufficient documentation

## 2023-02-12 DIAGNOSIS — R051 Acute cough: Secondary | ICD-10-CM

## 2023-02-12 DIAGNOSIS — R509 Fever, unspecified: Secondary | ICD-10-CM | POA: Insufficient documentation

## 2023-02-12 HISTORY — DX: Fever, unspecified: R50.9

## 2023-02-12 HISTORY — DX: Acute suppurative otitis media without spontaneous rupture of ear drum, left ear: H66.002

## 2023-02-12 HISTORY — DX: Acute cough: R05.1

## 2023-02-12 NOTE — Assessment & Plan Note (Signed)
Assessment: Onie presents with body aches, neck pain, low-grade fever, and a productive cough. Physical examination revealed rhonchi in the right lung and rib pain, likely exacerbated by coughing, raising concerns for community-acquired pneumonia.  Plan: - Prescribe Azithromycin and Augmentin to target typical and atypical bacterial pathogens - Advise continued use of Tylenol and ibuprofen for fever and pain management, alternating as previously instructed - Emphasize the importance of staying hydrated - Instruct patient to contact the office if unable to tolerate fluids or if symptoms worsen

## 2023-02-12 NOTE — Assessment & Plan Note (Signed)
Assessment: Examination revealed fluid in one ear without infection and confirmed infection in the left ear.  Plan: - The prescribed antibiotics (Azithromycin and Augmentin) should also address the ear infection - Monitor symptoms and follow up if no improvement is observed

## 2023-02-12 NOTE — Assessment & Plan Note (Signed)
Lindsay Griffin is on biologics which may compromise her immune system, contributing to her recurrent infections. Plan: - Suggest supplements including vitamin D, vitamin C, and zinc to potentially aid in boosting the immune system, though effectiveness may be limited due to the biologics - Continue close monitoring of her health, especially during periods of acute illness

## 2023-02-14 ENCOUNTER — Emergency Department (HOSPITAL_BASED_OUTPATIENT_CLINIC_OR_DEPARTMENT_OTHER): Payer: 59

## 2023-02-14 ENCOUNTER — Observation Stay (HOSPITAL_BASED_OUTPATIENT_CLINIC_OR_DEPARTMENT_OTHER)
Admission: EM | Admit: 2023-02-14 | Discharge: 2023-02-15 | Disposition: A | Discharge status: Home / Self Care | Payer: 59 | Source: Ambulatory Visit | Attending: Emergency Medicine | Admitting: Emergency Medicine

## 2023-02-14 ENCOUNTER — Other Ambulatory Visit: Payer: Self-pay

## 2023-02-14 ENCOUNTER — Encounter (HOSPITAL_BASED_OUTPATIENT_CLINIC_OR_DEPARTMENT_OTHER): Payer: Self-pay | Admitting: Emergency Medicine

## 2023-02-14 ENCOUNTER — Telehealth: Payer: Self-pay | Admitting: Nurse Practitioner

## 2023-02-14 DIAGNOSIS — R918 Other nonspecific abnormal finding of lung field: Secondary | ICD-10-CM | POA: Diagnosis present

## 2023-02-14 DIAGNOSIS — Z83719 Family history of colon polyps, unspecified: Secondary | ICD-10-CM

## 2023-02-14 DIAGNOSIS — J189 Pneumonia, unspecified organism: Secondary | ICD-10-CM | POA: Insufficient documentation

## 2023-02-14 DIAGNOSIS — Z8249 Family history of ischemic heart disease and other diseases of the circulatory system: Secondary | ICD-10-CM | POA: Diagnosis not present

## 2023-02-14 DIAGNOSIS — J441 Chronic obstructive pulmonary disease with (acute) exacerbation: Principal | ICD-10-CM | POA: Diagnosis present

## 2023-02-14 DIAGNOSIS — Z1152 Encounter for screening for COVID-19: Secondary | ICD-10-CM | POA: Insufficient documentation

## 2023-02-14 DIAGNOSIS — Z7960 Long term (current) use of unspecified immunomodulators and immunosuppressants: Secondary | ICD-10-CM | POA: Diagnosis not present

## 2023-02-14 DIAGNOSIS — Z79899 Other long term (current) drug therapy: Secondary | ICD-10-CM | POA: Insufficient documentation

## 2023-02-14 DIAGNOSIS — Z8701 Personal history of pneumonia (recurrent): Secondary | ICD-10-CM | POA: Diagnosis not present

## 2023-02-14 DIAGNOSIS — Z7952 Long term (current) use of systemic steroids: Secondary | ICD-10-CM | POA: Insufficient documentation

## 2023-02-14 DIAGNOSIS — R053 Chronic cough: Secondary | ICD-10-CM | POA: Diagnosis present

## 2023-02-14 DIAGNOSIS — H9203 Otalgia, bilateral: Secondary | ICD-10-CM | POA: Diagnosis present

## 2023-02-14 DIAGNOSIS — M069 Rheumatoid arthritis, unspecified: Principal | ICD-10-CM | POA: Diagnosis present

## 2023-02-14 DIAGNOSIS — D849 Immunodeficiency, unspecified: Secondary | ICD-10-CM

## 2023-02-14 DIAGNOSIS — D84821 Immunodeficiency due to drugs: Secondary | ICD-10-CM | POA: Diagnosis present

## 2023-02-14 DIAGNOSIS — R051 Acute cough: Secondary | ICD-10-CM | POA: Diagnosis present

## 2023-02-14 DIAGNOSIS — F17210 Nicotine dependence, cigarettes, uncomplicated: Secondary | ICD-10-CM | POA: Diagnosis not present

## 2023-02-14 DIAGNOSIS — L4050 Arthropathic psoriasis, unspecified: Secondary | ICD-10-CM | POA: Diagnosis present

## 2023-02-14 DIAGNOSIS — Z882 Allergy status to sulfonamides status: Secondary | ICD-10-CM

## 2023-02-14 DIAGNOSIS — Z823 Family history of stroke: Secondary | ICD-10-CM | POA: Diagnosis not present

## 2023-02-14 DIAGNOSIS — Z800 Family history of malignant neoplasm of digestive organs: Secondary | ICD-10-CM | POA: Diagnosis not present

## 2023-02-14 DIAGNOSIS — Z833 Family history of diabetes mellitus: Secondary | ICD-10-CM

## 2023-02-14 DIAGNOSIS — R059 Cough, unspecified: Secondary | ICD-10-CM | POA: Diagnosis present

## 2023-02-14 HISTORY — DX: Pneumonia, unspecified organism: J18.9

## 2023-02-14 LAB — CBC
HCT: 47.1 % — ABNORMAL HIGH (ref 36.0–46.0)
Hemoglobin: 15.5 g/dL — ABNORMAL HIGH (ref 12.0–15.0)
MCH: 31.5 pg (ref 26.0–34.0)
MCHC: 32.9 g/dL (ref 30.0–36.0)
MCV: 95.7 fL (ref 80.0–100.0)
Platelets: 182 10*3/uL (ref 150–400)
RBC: 4.92 MIL/uL (ref 3.87–5.11)
RDW: 13.5 % (ref 11.5–15.5)
WBC: 8.5 10*3/uL (ref 4.0–10.5)
nRBC: 0 % (ref 0.0–0.2)

## 2023-02-14 LAB — BASIC METABOLIC PANEL
Anion gap: 10 (ref 5–15)
BUN: 10 mg/dL (ref 6–20)
CO2: 27 mmol/L (ref 22–32)
Calcium: 9.5 mg/dL (ref 8.9–10.3)
Chloride: 100 mmol/L (ref 98–111)
Creatinine, Ser: 0.78 mg/dL (ref 0.44–1.00)
GFR, Estimated: >60 mL/min (ref >60)
Glucose, Bld: 94 mg/dL (ref 70–99)
Potassium: 3.8 mmol/L (ref 3.5–5.1)
Sodium: 137 mmol/L (ref 135–145)

## 2023-02-14 LAB — RESP PANEL BY RT-PCR (RSV, FLU A&B, COVID)  RVPGX2
Influenza A by PCR: NEGATIVE
Influenza B by PCR: NEGATIVE
Resp Syncytial Virus by PCR: NEGATIVE
SARS Coronavirus 2 by RT PCR: NEGATIVE

## 2023-02-14 LAB — RESPIRATORY PANEL BY PCR

## 2023-02-14 LAB — CBC WITH DIFFERENTIAL/PLATELET
Abs Immature Granulocytes: 0.03 10*3/uL (ref 0.00–0.07)
Basophils Absolute: 0 10*3/uL (ref 0.0–0.1)
Basophils Relative: 0 %
Eosinophils Absolute: 0.3 10*3/uL (ref 0.0–0.5)
Eosinophils Relative: 3 %
HCT: 47.3 % — ABNORMAL HIGH (ref 36.0–46.0)
Hemoglobin: 16.2 g/dL — ABNORMAL HIGH (ref 12.0–15.0)
Immature Granulocytes: 0 %
Lymphocytes Relative: 17 %
Lymphs Abs: 1.8 10*3/uL (ref 0.7–4.0)
MCH: 31.2 pg (ref 26.0–34.0)
MCHC: 34.2 g/dL (ref 30.0–36.0)
MCV: 91.1 fL (ref 80.0–100.0)
Monocytes Absolute: 0.9 10*3/uL (ref 0.1–1.0)
Monocytes Relative: 8 %
Neutro Abs: 7.9 10*3/uL — ABNORMAL HIGH (ref 1.7–7.7)
Neutrophils Relative %: 72 %
Platelets: 312 10*3/uL (ref 150–400)
RBC: 5.19 MIL/uL — ABNORMAL HIGH (ref 3.87–5.11)
RDW: 13.3 % (ref 11.5–15.5)
WBC: 10.9 10*3/uL — ABNORMAL HIGH (ref 4.0–10.5)
nRBC: 0 % (ref 0.0–0.2)

## 2023-02-14 LAB — URINALYSIS, ROUTINE W REFLEX MICROSCOPIC
Bilirubin Urine: NEGATIVE
Glucose, UA: NEGATIVE mg/dL
Ketones, ur: NEGATIVE mg/dL
Leukocytes,Ua: NEGATIVE
Nitrite: NEGATIVE
Protein, ur: NEGATIVE mg/dL
Specific Gravity, Urine: 1.015 (ref 1.005–1.030)
pH: 7 (ref 5.0–8.0)

## 2023-02-14 LAB — CREATININE, SERUM
Creatinine, Ser: 0.72 mg/dL (ref 0.44–1.00)
GFR, Estimated: >60 mL/min (ref >60)

## 2023-02-14 LAB — URINALYSIS, MICROSCOPIC (REFLEX): WBC, UA: NONE SEEN WBC/hpf (ref 0–5)

## 2023-02-14 LAB — STREP PNEUMONIAE URINARY ANTIGEN: Strep Pneumo Urinary Antigen: NEGATIVE

## 2023-02-14 LAB — GROUP A STREP BY PCR: Group A Strep by PCR: NOT DETECTED

## 2023-02-14 MED ORDER — IOHEXOL 300 MG/ML  SOLN
60.0000 mL | Freq: Once | INTRAMUSCULAR | Status: AC | PRN
  Administered 2023-02-14: 60 mL via INTRAVENOUS

## 2023-02-14 MED ORDER — ENOXAPARIN SODIUM 40 MG/0.4ML IJ SOSY
40.0000 mg | PREFILLED_SYRINGE | Freq: Every day | INTRAMUSCULAR | Status: DC
  Administered 2023-02-14: 40 mg via SUBCUTANEOUS
  Filled 2023-02-14: qty 0.4

## 2023-02-14 MED ORDER — SODIUM CHLORIDE 0.9 % IV SOLN
1.0000 g | Freq: Once | INTRAVENOUS | Status: AC
  Administered 2023-02-14: 1 g via INTRAVENOUS

## 2023-02-14 MED ORDER — SODIUM CHLORIDE 0.9 % IV SOLN: INTRAVENOUS | Status: DC

## 2023-02-14 MED ORDER — SODIUM CHLORIDE 0.9 % IV SOLN
500.0000 mg | Freq: Once | INTRAVENOUS | Status: AC
  Administered 2023-02-14: 500 mg via INTRAVENOUS
  Filled 2023-02-14: qty 5

## 2023-02-14 MED ORDER — PNEUMOCOCCAL 20-VAL CONJ VACC 0.5 ML IM SUSY
0.5000 mL | PREFILLED_SYRINGE | INTRAMUSCULAR | Status: DC
  Filled 2023-02-14: qty 0.5

## 2023-02-14 MED ORDER — SODIUM CHLORIDE 0.9 % IV SOLN
2.0000 g | Freq: Every day | INTRAVENOUS | Status: DC
  Administered 2023-02-14: 2 g via INTRAVENOUS
  Filled 2023-02-14 (×2): qty 20

## 2023-02-14 MED ORDER — SODIUM CHLORIDE 0.9 % IV SOLN
500.0000 mg | INTRAVENOUS | Status: DC
  Filled 2023-02-14: qty 5

## 2023-02-14 NOTE — ED Notes (Signed)
Report given to [facility] at [facility].

## 2023-02-14 NOTE — ED Notes (Signed)
Patient educated on where to go for admission.

## 2023-02-14 NOTE — ED Triage Notes (Signed)
Pt continues to have symptoms of pneumonia.  Cough, fever, body aches.  Pt sent here from Urgent care for evaluation of possible IV antibiotics.

## 2023-02-14 NOTE — Progress Notes (Signed)
Plan of Care Note for accepted transfer   Patient: Lindsay Griffin MRN: [id number]   DOA: 02/14/2023  Facility requesting transfer: [facility].  Requesting Provider: Sherian Maroon PA-C. Reason for transfer: [facility] Facility course:  Per EDP: "Chief Complaint  Patient presents with   Cough     Lindsay Griffin is a 47 y.o. female.   Cough    47 year old female presents emergency department with complaints of cough, body aches, bilateral ear pain.  Patient states that she has been with cough since May 3 and placed on azithromycin as well as amoxicillin on May 13 by primary care after diagnosis clinically of pneumonia.  Was seen in urgent care earlier today and sent to the emergency department for further assessment.  Patient states that cough remains unchanged.  Reports fever last night but without fever today.  Patient with history of psoriatic arthritis on immunosuppressive medication.  Denies chest pain, shortness of breath, abdominal pain, nausea, vomiting, urinary symptoms, change in bowel habits.   Past medical history significant for psoriatic arthritis on immunosuppressive medication, seasonal allergies, rheumatoid arthritis, anemia"  Lab work: Resp panel by RT-PCR (RSV, Flu A&B, Covid) Throat [[id number]]   Collected: 02/14/23 1142   Updated: 02/14/23 1239   Specimen Type: Nasal Swab   Specimen Source: Throat    SARS Coronavirus 2 by RT PCR NEGATIVE   Influenza A by PCR NEGATIVE   Influenza B by PCR NEGATIVE   Resp Syncytial Virus by PCR NEGATIVE  Urinalysis, Microscopic (reflex) [[id number]] (Abnormal)   Collected: 02/14/23 1142   Updated: 02/14/23 1238    RBC / HPF 0-5 RBC/hpf   WBC, UA NONE SEEN WBC/hpf   Bacteria, UA RARE Abnormal    Squamous Epithelial / HPF 0-5 /HPF  Urinalysis, Routine w reflex microscopic -Urine, Clean Catch [[id number]] (Abnormal)   Collected: 02/14/23 1142   Updated: 02/14/23 1238   Specimen Source: Urine, Clean Catch    Color, Urine  YELLOW   APPearance CLEAR   Specific Gravity, Urine 1.015   pH 7.0   Glucose, UA NEGATIVE mg/dL   Hgb urine dipstick MODERATE Abnormal    Bilirubin Urine NEGATIVE   Ketones, ur NEGATIVE mg/dL   Protein, ur NEGATIVE mg/dL   Nitrite NEGATIVE   Leukocytes,Ua NEGATIVE  Group A Strep by PCR [[id number]]   Collected: 02/14/23 1142   Updated: 02/14/23 1226   Specimen Type: Sterile Swab   Specimen Source: Throat    Group A Strep by PCR NOT DETECTED  Basic metabolic panel [[id number]]   Collected: 02/14/23 1142   Updated: 02/14/23 1216   Specimen Type: Blood   Specimen Source: Vein    Sodium 137 mmol/L   Potassium 3.8 mmol/L   Chloride 100 mmol/L   CO2 27 mmol/L   Glucose, Bld 94 mg/dL   BUN 10 mg/dL   Creatinine, Ser 7.42 mg/dL   Calcium 9.5 mg/dL   GFR, Estimated >59 mL/min   Anion gap 10  CBC with Differential [[id number]] (Abnormal)   Collected: 02/14/23 1142   Updated: 02/14/23 1202   Specimen Type: Blood   Specimen Source: Vein    WBC 10.9 High  K/uL   RBC 5.19 High  MIL/uL   Hemoglobin 16.2 High  g/dL   HCT 32.9 High  %   MCV 91.1 fL   MCH 31.2 pg   MCHC 34.2 g/dL   RDW 51.8 %   Platelets 312 K/uL   nRBC 0.0 %   Neutrophils Relative % 72 %  Neutro Abs 7.9 High  K/uL   Lymphocytes Relative 17 %   Lymphs Abs 1.8 K/uL   Monocytes Relative 8 %   Monocytes Absolute 0.9 K/uL   Eosinophils Relative 3 %   Eosinophils Absolute 0.3 K/uL   Basophils Relative 0 %   Basophils Absolute 0.0 K/uL   Immature Granulocytes 0 %   Abs Immature Granulocytes 0.03 K/uL   Imaging: 2 view chest radiograph: No active cardiopulmonary disease.  CT CHEST WITH CONTRAST   TECHNIQUE: Multidetector CT imaging of the chest was performed during intravenous contrast administration.   RADIATION DOSE REDUCTION: This exam was performed according to the departmental dose-optimization program which includes automated exposure control, adjustment of the mA and/or kV according to patient  size and/or use of iterative reconstruction technique.   CONTRAST:  60mL OMNIPAQUE IOHEXOL 300 MG/ML  SOLN   COMPARISON:  None Available.   FINDINGS: Cardiovascular: Normal heart size. No pericardial effusion. Normal caliber thoracic aorta no significant atherosclerotic disease.   Mediastinum/Nodes: Esophagus and thyroid are unremarkable. No enlarged lymph nodes seen in the chest.   Lungs/Pleura: Central airways are patent. Clustered ground-glass nodules of the right lower lobe and superior portion of the left lower lobe, largest measures 6 mm on series 3, image 110. Additional basilar predominant mild scattered linear opacities which are likely due to scarring or atelectasis. No consolidation, pleural effusion or pneumothorax.   Upper Abdomen: No acute abnormality.   Musculoskeletal: No chest wall abnormality. No acute or significant osseous findings.   IMPRESSION: Clustered ground-glass nodules of the right lower lobe and superior portion of the left lower lobe, largest measures 6 mm. Findings are likely due to infection or aspiration. Non-contrast chest CT at 3-6 months is recommended. If nodules persist, subsequent management will be based upon the most suspicious nodule(s). This recommendation follows the consensus statement: Guidelines for Management of Incidental Pulmonary Nodules Detected on CT Images: From the Fleischner Society 2017; Radiology 2017; 284:228-243.   Electronically Signed   By: Allegra Lai M.D.   On: 02/14/2023 13:09   Plan of care: The patient is accepted for admission to Med-surg  unit, at [facility].  Author: Bobette Mo, MD 02/14/2023  Check [email or web address] for on-call coverage.  Nursing staff, Please call [facility] Admits & Consults System-Wide number on Amion as soon as patient's arrival, so appropriate admitting provider can evaluate the pt.

## 2023-02-14 NOTE — Telephone Encounter (Signed)
Pt called and states that she is still coughing and her ear is hurting. And has a fever, and is coughing up green stuff, States she finished her rx that was given, says she feels about 10 percent better she wants to know  if there is something else she can take, Pt uses  [facility] [id number] - [place], [place] - [address]

## 2023-02-14 NOTE — ED Provider Notes (Signed)
[facility] Provider Note   CSN: [id number] Arrival date & time: 02/14/23  1047     History  Chief Complaint  Patient presents with   Cough    Lindsay Griffin is a 47 y.o. female.   Cough   47 year old female presents emergency department with complaints of cough, body aches, bilateral ear pain.  Patient states that she has been with cough since May 3 and placed on azithromycin as well as amoxicillin on May 13 by primary care after diagnosis clinically of pneumonia.  Was seen in urgent care earlier today and sent to the emergency department for further assessment.  Patient states that cough remains unchanged.  Reports fever last night but without fever today.  Patient with history of psoriatic arthritis on golimumab.  Denies chest pain, shortness of breath, abdominal pain, nausea, vomiting, urinary symptoms, change in bowel habits.  Past medical history significant for psoriatic arthritis on golimumab, seasonal allergies, rheumatoid arthritis, anemia  Home Medications Prior to Admission medications   Medication Sig Start Date End Date Taking? Authorizing Provider  diclofenac (VOLTAREN) 75 MG EC tablet Take 75 mg by mouth as needed for mild pain.   Yes [provider]  fexofenadine (ALLEGRA) 180 MG tablet Take 180 mg by mouth daily. Pt unsure of dosage.   Yes [provider]  fluticasone (FLONASE) 50 MCG/ACT nasal spray Place 1 spray into both nostrils daily.   Yes [provider]  golimumab (SIMPONI ARIA) 50 MG/4ML SOLN injection Inject into the vein See admin instructions. Every other month.   Yes [provider]  ibuprofen (ADVIL) 200 MG tablet Take 400 mg by mouth every 6 (six) hours as needed.   Yes [provider]  metroNIDAZOLE (FLAGYL) 500 MG tablet Take 500 mg by mouth 2 (two) times daily. Take for 7 days 02/12/23  Yes [provider]  Olopatadine HCl (PATADAY OP) Place 1 drop  into both eyes as needed (allergies).   Yes [provider]  ondansetron (ZOFRAN) 4 MG tablet Take 1 tablet (4 mg total) by mouth every 8 (eight) hours as needed for nausea or vomiting. 01/27/23  Yes Early, Sung Amabile, NP  polyethylene glycol (MIRALAX / GLYCOLAX) 17 g packet Take 17 g by mouth daily.   Yes [provider]  Probiotic Product (PROBIOTIC-10 PO) Take 1 capsule by mouth daily. Women's health probiotic from [employer]   Yes [provider]  ZINC-VITAMIN C PO Take 1 tablet by mouth daily. Chewables   Yes [provider]  amoxicillin-clavulanate (AUGMENTIN) 875-125 MG tablet Take 1 tablet by mouth 2 (two) times daily. Patient not taking: Reported on 02/14/2023 01/27/23   Tollie Eth, NP      Allergies    Sulfa antibiotics    Review of Systems   Review of Systems  Respiratory:  Positive for cough.   All other systems reviewed and are negative.   Physical Exam Updated Vital Signs BP 135/84   Pulse 78   Temp 98.8 F (37.1 C) (Oral)   Resp 18   Ht 5\' 2"  (1.575 m)   Wt 50.8 kg   LMP 01/25/2023   SpO2 100%   BMI 20.49 kg/m  Physical Exam Vitals and nursing note reviewed.  Constitutional:      General: She is not in acute distress.    Appearance: She is well-developed.  HENT:     Head: Normocephalic and atraumatic.     Right Ear: Tympanic  membrane, ear canal and external ear normal.     Left Ear: Tympanic membrane, ear canal and external ear normal.     Nose: Nose normal.     Mouth/Throat:     Comments: Mild posterior pharyngeal erythema.  Uvula midline rise symmetric with phonation.  Tonsils 1-2+ bilaterally with no obvious exudate.  No sublingual or submandibular swelling appreciated. Eyes:     Conjunctiva/sclera: Conjunctivae normal.  Cardiovascular:     Rate and Rhythm: Normal rate and regular rhythm.     Heart sounds: No murmur heard. Pulmonary:     Effort: Pulmonary effort is normal. No respiratory distress.     Breath sounds:  Rales present. No wheezing or rhonchi.     Comments: Very faint Rales auscultated right lower lung field Abdominal:     Palpations: Abdomen is soft.     Tenderness: There is no abdominal tenderness. There is no guarding.  Musculoskeletal:        General: No swelling.     Cervical back: Neck supple.     Right lower leg: No edema.     Left lower leg: No edema.  Skin:    General: Skin is warm and dry.     Capillary Refill: Capillary refill takes less than 2 seconds.  Neurological:     Mental Status: She is alert.  Psychiatric:        Mood and Affect: Mood normal.     ED Results / Procedures / Treatments   Labs (all labs ordered are listed, but only abnormal results are displayed) Labs Reviewed  URINALYSIS, ROUTINE W REFLEX MICROSCOPIC - Abnormal; Notable for the following components:      Result Value   Hgb urine dipstick MODERATE (*)    All other components within normal limits  CBC WITH DIFFERENTIAL/PLATELET - Abnormal; Notable for the following components:   WBC 10.9 (*)    RBC 5.19 (*)    Hemoglobin 16.2 (*)    HCT 47.3 (*)    Neutro Abs 7.9 (*)    All other components within normal limits  URINALYSIS, MICROSCOPIC (REFLEX) - Abnormal; Notable for the following components:   Bacteria, UA RARE (*)    All other components within normal limits  GROUP A STREP BY PCR  RESP PANEL BY RT-PCR (RSV, FLU A&B, COVID)  [id number]  BASIC METABOLIC PANEL    EKG None  Radiology CT Chest W Contrast  Result Date: 02/14/2023 CLINICAL DATA:  Chronic cough EXAM: CT CHEST WITH CONTRAST TECHNIQUE: Multidetector CT imaging of the chest was performed during intravenous contrast administration. RADIATION DOSE REDUCTION: This exam was performed according to the departmental dose-optimization program which includes automated exposure control, adjustment of the mA and/or kV according to patient size and/or use of iterative reconstruction technique. CONTRAST:  60mL OMNIPAQUE IOHEXOL 300 MG/ML  SOLN  COMPARISON:  None Available. FINDINGS: Cardiovascular: Normal heart size. No pericardial effusion. Normal caliber thoracic aorta no significant atherosclerotic disease. Mediastinum/Nodes: Esophagus and thyroid are unremarkable. No enlarged lymph nodes seen in the chest. Lungs/Pleura: Central airways are patent. Clustered ground-glass nodules of the right lower lobe and superior portion of the left lower lobe, largest measures 6 mm on series 3, image 110. Additional basilar predominant mild scattered linear opacities which are likely due to scarring or atelectasis. No consolidation, pleural effusion or pneumothorax. Upper Abdomen: No acute abnormality. Musculoskeletal: No chest wall abnormality. No acute or significant osseous findings. IMPRESSION: Clustered ground-glass nodules of the right lower lobe and superior portion of the  left lower lobe, largest measures 6 mm. Findings are likely due to infection or aspiration. Non-contrast chest CT at 3-6 months is recommended. If nodules persist, subsequent management will be based upon the most suspicious nodule(s). This recommendation follows the consensus statement: Guidelines for Management of Incidental Pulmonary Nodules Detected on CT Images: From the Fleischner Society 2017; Radiology 2017; 284:228-243. Electronically Signed   By: Allegra Lai M.D.   On: 02/14/2023 13:09   DG Chest 2 View  Result Date: 02/14/2023 CLINICAL DATA:  Cough, fever, and body aches. EXAM: CHEST - 2 VIEW COMPARISON:  12/29/2019 FINDINGS: The heart size and mediastinal contours are within normal limits. Both lungs are clear. The visualized skeletal structures are unremarkable. IMPRESSION: No active cardiopulmonary disease. Electronically Signed   By: Danae Orleans M.D.   On: 02/14/2023 12:14    Procedures Procedures    Medications Ordered in ED Medications  iohexol (OMNIPAQUE) 300 MG/ML solution 60 mL (60 mLs Intravenous Contrast Given 02/14/23 1245)  ceFEPIme (MAXIPIME) 1 g in  sodium chloride 0.9 % 100 mL IVPB (0 g Intravenous Stopped 02/14/23 1545)  azithromycin (ZITHROMAX) 500 mg in sodium chloride 0.9 % 250 mL IVPB (0 mg Intravenous Stopped 02/14/23 1500)    ED Course/ Medical Decision Making/ A&P Clinical Course as of 02/14/23 1556  Fri Feb 14, 2023  1354 Consulted hospitalist Dr. Robb Matar who agreed with admission and assume further treatment/care. [CR]    Clinical Course User Index [CR] Peter Garter, PA                             Medical Decision Making Amount and/or Complexity of Data Reviewed Labs: ordered. Radiology: ordered.  Risk Prescription drug management. Decision regarding hospitalization.   This patient presents to the ED for concern of cough, fever, this involves an extensive number of treatment options, and is a complaint that carries with it a high risk of complications and morbidity.  The differential diagnosis includes COVID, RSV, pneumonia, malignancy, sepsis, viral URI, GERD, asthma/COPD    Co morbidities that complicate the patient evaluation  See HPI   Additional history obtained:  Additional history obtained from EMR External records from outside source obtained and reviewed including hospital records   Lab Tests:  I Ordered, and personally interpreted labs.  The pertinent results include: Mild leukocytosis of 10.9 with left shift.  No evidence of anemia.  Platelets within normal range.  No electrolyte abnormalities.  No renal dysfunction.  UA significant for moderate hemoglobin rare bacteria but otherwise unremarkable.  As per viral panel negative.  Group A strep negative.   Imaging Studies ordered:  I ordered imaging studies including chest x-ray, CT chest I independently visualized and interpreted imaging which showed  Chest x-ray: No acute cardiopulmonary abnormalities CT chest: Clustered groundglass nodules in right lower lobe as well as superior portion of left lower lobe largest 6 mm. I agree with the  radiologist interpretation  Cardiac Monitoring: / EKG:  The patient was maintained on a cardiac monitor.  I personally viewed and interpreted the cardiac monitored which showed an underlying rhythm of: Sinus rhythm   Consultations Obtained:  See ED course  Problem List / ED Course / Critical interventions / Medication management  Pneumonia I ordered medication including azithromycin, cefepime   Reevaluation of the patient after these medicines showed that the patient improved I have reviewed the patients home medicines and have made adjustments as needed   Social Determinants  of Health:  Chronic cigarette use.  Denies illicit drug use.   Test / Admission - Considered:  Pneumonia Vitals signs within normal range and stable throughout visit. Laboratory/imaging studies significant for: See above 47 year old female presents emergency department with now 28 to 29 days of cough.  Patient already treated in the outpatient setting with oral antibiotics in the form amoxicillin as well as azithromycin with presumed treatment failure.  Patient currently with evidence of continued pneumonia.  Given patient's immunocompromise status on golimumab as well as failure of outpatient oral antibiotics, admission deemed most appropriate.  Patient begun on hospital-acquired pneumonia antibiotics for treatment of pneumonia.  Appropriate consultations were made as depicted in ED course.  Treatment plan discussed at length with patient and she acknowledged and was agreeable to said plan.  Patient stable upon admission to the hospital.         Final Clinical Impression(s) / ED Diagnoses Final diagnoses:  Pneumonia due to infectious organism, unspecified laterality, unspecified part of lung  Immunocompromised [facility])    Rx / DC Orders ED Discharge Orders     None         Peter Garter, [place] 02/14/23 1556    Loetta Rough, MD 02/22/23 [phone]

## 2023-02-14 NOTE — H&P (Signed)
History and Physical    Lindsay Griffin:[id number] DOB: 28-Sep-1975 DOA: 02/14/2023  PCP: Tollie Eth, NP  Patient coming from: Home  I have personally briefly reviewed patient's old medical records in [facility] Link  Chief Complaint: Persistent cough  HPI: Lindsay Griffin is a 47 y.o. female with medical history significant of psoriatic arthritis on golimumab and nicotine abuse who initially went to urgent care center for persistent cough and was sent to the emergency department from care.  According to patient, she started cough about a month ago.  She has been given 2 antibiotics azithromycin and amoxicillin at the same time, last dose on 02/01/2023, but she continues to have productive cough with greenish sputum production and developed fever of 101.2 last night so she went to see urgent care provider today and was advised to go to the emergency department.  Patient continues to smoke 1 pack a day, no recent travel or sick contact.  No other complaints.  ED Course: Upon arrival to ED, she was fairly hemodynamically stable.  CBC and BMP fairly unremarkable.  Chest x-ray followed by CT chest showed evidence of bilateral pneumonia/lung nodules.  Patient was given cefepime and hospitalist were requested to admit the patient for further management.  Review of Systems: As per HPI otherwise negative.    Past Medical History:  Diagnosis Date   Abnormal Pap smear    Acute upper respiratory infections of unspecified site    AMA (advanced maternal age) multigravida 35+    Anemia    per pt, several years ago as of 08/13/21   Anxiety    Arthritis    Elevated LDL cholesterol level 12/30/2019   External hemorrhoids without mention of complication    Ganglion cyst 2022   right dorsal carpal ganglion cyst   H/O varicella    as a child   Lichen sclerosus    Mastodynia    Other specified visual disturbances    with pregnancy   Psoriasis    Psoriatic arthritis (HCC)    manged by Dr.  Wallace Cullens   Retention of urine, unspecified    Rubella without mention of complication    unsure if measles or allergic reaction to sulfa   Scleritis 05/14/2021   right eye    Past Surgical History:  Procedure Laterality Date   BREAST BIOPSY Left 04/28/2017   COLONOSCOPY  10/03/2017   CRYOTHERAPY  1994   cryotherapy of vaginal lesion   DILATION AND CURETTAGE OF UTERUS  07/17/2010   FOOT SURGERY     R x2   GANGLION CYST EXCISION Right 08/16/2021   Procedure: Right dorsal carpal ganglion cyst excision;  Surgeon: Gomez Cleverly, MD;  Location: [facility] [place];  Service: Orthopedics;  Laterality: Right;  with local anesthesia   HEMORRHOID BANDING  01/07/2018   LASIK     SEPTOPLASTY  2019     reports that she has been smoking cigarettes. She has a 27.00 pack-year smoking history. She has never used smokeless tobacco. She reports current alcohol use of about 6.0 - 8.0 standard drinks of alcohol per week. She reports that she does not use drugs.  Allergies  Allergen Reactions   Sulfa Antibiotics Hives, Rash and Other (See Comments)    As a child; Occurred in Early Childhood     Family History  Problem Relation Age of Onset   Pancreatitis Father    Diabetes Mother    Cancer Mother        cervical  Hypertension Mother    Colon polyps Mother    Stroke Maternal Grandmother    Heart disease Maternal Grandmother    Cancer Maternal Grandfather        colon   Colon cancer Maternal Grandfather    Diabetes Paternal Grandfather    Breast cancer Neg Hx     Prior to Admission medications   Medication Sig Start Date End Date Taking? Authorizing Provider  diclofenac (VOLTAREN) 75 MG EC tablet Take 75 mg by mouth as needed for mild pain.   Yes [provider]  fexofenadine (ALLEGRA) 180 MG tablet Take 180 mg by mouth daily. Pt unsure of dosage.   Yes [provider]  fluticasone (FLONASE) 50 MCG/ACT nasal spray Place 1 spray into both nostrils daily.   Yes  [provider]  golimumab (SIMPONI ARIA) 50 MG/4ML SOLN injection Inject into the vein See admin instructions. Every other month.   Yes [provider]  ibuprofen (ADVIL) 200 MG tablet Take 400 mg by mouth every 6 (six) hours as needed.   Yes [provider]  metroNIDAZOLE (FLAGYL) 500 MG tablet Take 500 mg by mouth 2 (two) times daily. Take for 7 days 02/12/23  Yes [provider]  Olopatadine HCl (PATADAY OP) Place 1 drop into both eyes as needed (allergies).   Yes [provider]  ondansetron (ZOFRAN) 4 MG tablet Take 1 tablet (4 mg total) by mouth every 8 (eight) hours as needed for nausea or vomiting. 01/27/23  Yes Early, Sung Amabile, NP  polyethylene glycol (MIRALAX / GLYCOLAX) 17 g packet Take 17 g by mouth daily.   Yes [provider]  Probiotic Product (PROBIOTIC-10 PO) Take 1 capsule by mouth daily. Women's health probiotic from [employer]   Yes [provider]  ZINC-VITAMIN C PO Take 1 tablet by mouth daily. Chewables   Yes [provider]  amoxicillin-clavulanate (AUGMENTIN) 875-125 MG tablet Take 1 tablet by mouth 2 (two) times daily. Patient not taking: Reported on 02/14/2023 01/27/23   Tollie Eth, NP    Physical Exam: Vitals:   02/14/23 1530 02/14/23 1545 02/14/23 1553 02/14/23 1720  BP: 121/89 135/84  (!) 130/93  Pulse: 74 78  91  Resp:   18   Temp:   98.8 F (37.1 C) 98.3 F (36.8 C)  TempSrc:   Oral Oral  SpO2: 98% 100%  97%  Weight:    50.8 kg  Height:    5\' 2"  (1.575 m)    Constitutional: NAD, calm, comfortable Vitals:   02/14/23 1530 02/14/23 1545 02/14/23 1553 02/14/23 1720  BP: 121/89 135/84  (!) 130/93  Pulse: 74 78  91  Resp:   18   Temp:   98.8 F (37.1 C) 98.3 F (36.8 C)  TempSrc:   Oral Oral  SpO2: 98% 100%  97%  Weight:    50.8 kg  Height:    5\' 2"  (1.575 m)   Eyes: PERRL, lids and conjunctivae normal ENMT: Mucous membranes are moist. Posterior pharynx clear of any exudate or  lesions.Normal dentition.  Neck: normal, supple, no masses, no thyromegaly Respiratory: Scattered rhonchi bilaterally with coarse breath sounds at times.  No wheezes. Cardiovascular: Regular rate and rhythm, no murmurs / rubs / gallops. No extremity edema. 2+ pedal pulses. No carotid bruits.  Abdomen: no tenderness, no masses palpated. No hepatosplenomegaly. Bowel sounds positive.  Musculoskeletal: no clubbing / cyanosis. No joint deformity upper and lower extremities. Good ROM, no contractures. Normal muscle tone.  Skin: no rashes, lesions, ulcers. No induration Neurologic: CN 2-12 grossly intact. Sensation intact, DTR normal. Strength 5/5 in all 4.  Psychiatric: Normal judgment and insight. Alert and oriented x 3. Normal mood.    Labs on Admission: I have personally reviewed following labs and imaging studies  CBC: Recent Labs  Lab 02/14/23 1142  WBC 10.9*  NEUTROABS 7.9*  HGB 16.2*  HCT 47.3*  MCV 91.1  PLT 312   Basic Metabolic Panel: Recent Labs  Lab 02/14/23 1142  NA 137  K 3.8  CL 100  CO2 27  GLUCOSE 94  BUN 10  CREATININE 0.78  CALCIUM 9.5   GFR: Estimated Creatinine Clearance: 70.2 mL/min (by C-G formula based on SCr of 0.78 mg/dL). Liver Function Tests: No results for input(s): "AST", "ALT", "ALKPHOS", "BILITOT", "PROT", "ALBUMIN" in the last 168 hours. No results for input(s): "LIPASE", "AMYLASE" in the last 168 hours. No results for input(s): "AMMONIA" in the last 168 hours. Coagulation Profile: No results for input(s): "INR", "PROTIME" in the last 168 hours. Cardiac Enzymes: No results for input(s): "CKTOTAL", "CKMB", "CKMBINDEX", "TROPONINI" in the last 168 hours. BNP (last 3 results) No results for input(s): "PROBNP" in the last 8760 hours. HbA1C: No results for input(s): "HGBA1C" in the last 72 hours. CBG: No results for input(s): "GLUCAP" in the last 168 hours. Lipid Profile: No results for input(s): "CHOL", "HDL", "LDLCALC", "TRIG", "CHOLHDL",  "LDLDIRECT" in the last 72 hours. Thyroid Function Tests: No results for input(s): "TSH", "T4TOTAL", "FREET4", "T3FREE", "THYROIDAB" in the last 72 hours. Anemia Panel: No results for input(s): "VITAMINB12", "FOLATE", "FERRITIN", "TIBC", "IRON", "RETICCTPCT" in the last 72 hours. Urine analysis:    Component Value Date/Time   COLORURINE YELLOW 02/14/2023 1142   APPEARANCEUR CLEAR 02/14/2023 1142   LABSPEC 1.015 02/14/2023 1142   PHURINE 7.0 02/14/2023 1142   GLUCOSEU NEGATIVE 02/14/2023 1142   HGBUR MODERATE (A) 02/14/2023 1142   BILIRUBINUR NEGATIVE 02/14/2023 1142   KETONESUR NEGATIVE 02/14/2023 1142   PROTEINUR NEGATIVE 02/14/2023 1142   NITRITE NEGATIVE 02/14/2023 1142   LEUKOCYTESUR NEGATIVE 02/14/2023 1142    Radiological Exams on Admission: CT Chest W Contrast  Result Date: 02/14/2023 CLINICAL DATA:  Chronic cough EXAM: CT CHEST WITH CONTRAST TECHNIQUE: Multidetector CT imaging of the chest was performed during intravenous contrast administration. RADIATION DOSE REDUCTION: This exam was performed according to the departmental dose-optimization program which includes automated exposure control, adjustment of the mA and/or kV according to patient size and/or use of iterative reconstruction technique. CONTRAST:  60mL OMNIPAQUE IOHEXOL 300 MG/ML  SOLN COMPARISON:  None Available. FINDINGS: Cardiovascular: Normal heart size. No pericardial effusion. Normal caliber thoracic aorta no significant atherosclerotic disease. Mediastinum/Nodes: Esophagus and thyroid are unremarkable. No enlarged lymph nodes seen in the chest. Lungs/Pleura: Central airways are patent. Clustered ground-glass nodules of the right lower lobe and superior portion of the left lower lobe, largest measures 6 mm on series 3, image 110. Additional basilar predominant mild scattered linear opacities which are likely due to scarring or atelectasis. No consolidation, pleural effusion or pneumothorax. Upper Abdomen: No acute  abnormality. Musculoskeletal: No chest wall abnormality. No acute or significant osseous findings. IMPRESSION: Clustered ground-glass nodules of the right lower lobe and superior portion of the left lower lobe, largest measures 6 mm. Findings are likely due to infection or aspiration. Non-contrast chest CT at 3-6 months is recommended. If nodules persist, subsequent management will be based upon the most suspicious nodule(s). This recommendation follows the consensus statement: Guidelines for Management of  Incidental Pulmonary Nodules Detected on CT Images: From the Fleischner Society 2017; Radiology 2017; 284:228-243. Electronically Signed   By: Allegra Lai M.D.   On: 02/14/2023 13:09   DG Chest 2 View  Result Date: 02/14/2023 CLINICAL DATA:  Cough, fever, and body aches. EXAM: CHEST - 2 VIEW COMPARISON:  12/29/2019 FINDINGS: The heart size and mediastinal contours are within normal limits. Both lungs are clear. The visualized skeletal structures are unremarkable. IMPRESSION: No active cardiopulmonary disease. Electronically Signed   By: Danae Orleans M.D.   On: 02/14/2023 12:14     Assessment/Plan Principal Problem:   CAP (community acquired pneumonia) Active Problems:   Rheumatoid arthritis (HCC)   Community-acquired pneumonia: I do not think that patient meets criteria for healthcare associated pneumonia.  She does seem to have failed outpatient antibiotic therapy.  I will start her on Rocephin and Zithromax.  Check sputum culture, urine antigen for Legionella and streptococci.  She is not hypoxic and not septic at all.  She is afebrile.  DVT prophylaxis: enoxaparin (LOVENOX) injection 40 mg Start: 02/14/23 1730 Code Status: Full code Family Communication: none present at bedside.  Plan of care discussed with patient in length and he verbalized understanding and agreed with it. Disposition Plan: May discharge in 1 to 2 days Consults called: None  Hughie Closs MD [facility]  *Please note that this is a verbal dictation therefore any spelling or grammatical errors are due to the "[employer] One" system interpretation.  Please page via Amion and do not message via secure chat for urgent patient care matters. Secure chat can be used for non urgent patient care matters. 02/14/2023, 6:00 PM  To contact the attending provider between 7A-7P or the covering provider during after hours 7P-7A, please log into the web site [email or web address]

## 2023-02-14 NOTE — ED Provider Notes (Signed)
Upon reassessment and after patient already admitted to the hospital, patient refusing clearly transferred to [facility].  She subsequently decided to travel POV with her husband to avoid cost of [facility].  Staff will remove IV and patient will be given appropriate directions to [facility] for admission.   Peter Garter, [place] 02/14/23 1550    Loetta Rough, MD 02/22/23 [phone]

## 2023-02-15 DIAGNOSIS — R051 Acute cough: Secondary | ICD-10-CM

## 2023-02-15 LAB — HIV ANTIBODY (ROUTINE TESTING W REFLEX): HIV Screen 4th Generation wRfx: NONREACTIVE

## 2023-02-15 NOTE — Plan of Care (Signed)
Patient stable for discharge per MD order. Patient show no sign or symptoms of acute distress at discharge.  Problem: Education: Goal: Knowledge of General Education information will improve Description: Including pain rating scale, medication(s)/side effects and non-pharmacologic comfort measures Outcome: Adequate for Discharge   Problem: Health Behavior/Discharge Planning: Goal: Ability to manage health-related needs will improve Outcome: Adequate for Discharge   Problem: Clinical Measurements: Goal: Ability to maintain clinical measurements within normal limits will improve Outcome: Adequate for Discharge Goal: Will remain free from infection Outcome: Adequate for Discharge Goal: Diagnostic test results will improve Outcome: Adequate for Discharge Goal: Respiratory complications will improve Outcome: Adequate for Discharge Goal: Cardiovascular complication will be avoided Outcome: Adequate for Discharge   Problem: Activity: Goal: Risk for activity intolerance will decrease Outcome: Adequate for Discharge   Problem: Nutrition: Goal: Adequate nutrition will be maintained Outcome: Adequate for Discharge   Problem: Coping: Goal: Level of anxiety will decrease Outcome: Adequate for Discharge   Problem: Elimination: Goal: Will not experience complications related to bowel motility Outcome: Adequate for Discharge Goal: Will not experience complications related to urinary retention Outcome: Adequate for Discharge   Problem: Pain Managment: Goal: General experience of comfort will improve Outcome: Adequate for Discharge   Problem: Safety: Goal: Ability to remain free from injury will improve Outcome: Adequate for Discharge   Problem: Skin Integrity: Goal: Risk for impaired skin integrity will decrease Outcome: Adequate for Discharge

## 2023-02-15 NOTE — Progress Notes (Signed)
Transition of Care [facility]) - Inpatient Brief Assessment   Patient Details  Name: Lindsay Griffin MRN: [id number] Date of Birth: 01-03-1976  Transition of Care [facility] CM/SW Contact:    Adrian Prows, RN Phone Number: 02/15/2023, 9:58 AM   Clinical Narrative: Sherron Monday w/ pt via phone; TOC brief assessment completed   Transition of Care Asessment: Insurance and Status: Insurance coverage has been reviewed Patient has primary care physician: Yes Home environment has been reviewed: patient feels safe returning home; she denies IPV Prior level of function:: independent Prior/Current Home Services: No current home services Social Determinants of Health Reivew: SDOH reviewed no interventions necessary Readmission risk has been reviewed: Yes Transition of care needs: no transition of care needs at this time

## 2023-02-15 NOTE — Progress Notes (Signed)
SATURATION QUALIFICATIONS: (This note is used to comply with regulatory documentation for home oxygen)  Patient Saturations on Room Air at Rest = 100%  Patient Saturations on Room Air while Ambulating = 100%   Please briefly explain why patient needs home oxygen: No oxygen needed.

## 2023-02-15 NOTE — Discharge Summary (Signed)
Physician Discharge Summary  Lindsay Griffin ZOX:[id number] DOB: 03-Oct-1975 DOA: 02/14/2023  PCP: Tollie Eth, NP  Admit date: 02/14/2023 Discharge date: 02/15/2023  Admitted From: Home Disposition: Home  Recommendations for Outpatient Follow-up:  Follow up with PCP in 1-2 weeks Follow-up with pulmonology as discussed given incidental nodule noted on imaging  Home Health: None Equipment/Devices: None  Discharge Condition: Stable CODE STATUS: Full Diet recommendation: Regular diet as tolerated  Brief/Interim Summary: Lindsay Griffin is a 47 y.o. female with medical history significant of psoriatic arthritis on golimumab and nicotine abuse recently diagnosed with pneumonia earlier in May.  She continued to have cough, questionably productive and reports a single fever and was concern for recurrent episode of pneumonia and reported to urgent care.  Unfortunately at urgent care she was told "you might die if you get sick" presumably in the setting of monoclonal antibody usage.    Patient was admitted here for failure of outpatient therapy as she had previously had azithromycin and amoxicillin.  Patient remains afebrile, CT personally reviewed, no overt or obvious pneumonia/infiltrate or atelectasis was noted.  Patient has no white count and otherwise viral panel is negative.  She remains on room air without hypoxia even with exertion.  Given patient is essentially asymptomatic with negative imaging, and negative labs - antibiotics will be discontinued and patient will be discharged home for close follow-up with PCP as scheduled.  Incidentally notable on patient CT per radiology are multiple groundglass nodules measuring up to 6 mm.  Recommending repeat CT in 3 to 6 months.  Given patient's history, and ongoing use, of tobacco abuse at a pack a day we discussed following up with pulmonology for closer monitoring and routine imaging.  Given patient's recurrent symptoms of cough without overt signs  of infection patient may be suffering from COPD exacerbation but has not formally been diagnosed and has never had PFTs.  Would recommend following up for these as well.  Discharge Diagnoses:  Principal Problem:   Rheumatoid arthritis (HCC) Active Problems:   Acute cough    Discharge Instructions   Allergies as of 02/15/2023       Reactions   Sulfa Antibiotics Hives, Rash, Other (See Comments)   As a child; Occurred in Early Childhood        Medication List     STOP taking these medications    amoxicillin-clavulanate 875-125 MG tablet Commonly known as: AUGMENTIN   ibuprofen 200 MG tablet Commonly known as: ADVIL   metroNIDAZOLE 500 MG tablet Commonly known as: FLAGYL       TAKE these medications    diclofenac 75 MG EC tablet Commonly known as: VOLTAREN Take 75 mg by mouth as needed for mild pain.   fexofenadine 180 MG tablet Commonly known as: ALLEGRA Take 180 mg by mouth daily. Pt unsure of dosage.   fluticasone 50 MCG/ACT nasal spray Commonly known as: FLONASE Place 1 spray into both nostrils daily.   golimumab 50 MG/4ML Soln injection Commonly known as: SIMPONI ARIA Inject into the vein See admin instructions. Every other month.   ondansetron 4 MG tablet Commonly known as: Zofran Take 1 tablet (4 mg total) by mouth every 8 (eight) hours as needed for nausea or vomiting.   PATADAY OP Place 1 drop into both eyes as needed (allergies).   polyethylene glycol 17 g packet Commonly known as: MIRALAX / GLYCOLAX Take 17 g by mouth daily.   PROBIOTIC-10 PO Take 1 capsule by mouth daily. Women's health probiotic  from [employer]   ZINC-VITAMIN C PO Take 1 tablet by mouth daily. Chewables        Allergies  Allergen Reactions   Sulfa Antibiotics Hives, Rash and Other (See Comments)    As a child; Occurred in Early Childhood     Consultations: None  Procedures/Studies: CT Chest W Contrast  Result Date: 02/14/2023 CLINICAL DATA:  Chronic  cough EXAM: CT CHEST WITH CONTRAST TECHNIQUE: Multidetector CT imaging of the chest was performed during intravenous contrast administration. RADIATION DOSE REDUCTION: This exam was performed according to the departmental dose-optimization program which includes automated exposure control, adjustment of the mA and/or kV according to patient size and/or use of iterative reconstruction technique. CONTRAST:  60mL OMNIPAQUE IOHEXOL 300 MG/ML  SOLN COMPARISON:  None Available. FINDINGS: Cardiovascular: Normal heart size. No pericardial effusion. Normal caliber thoracic aorta no significant atherosclerotic disease. Mediastinum/Nodes: Esophagus and thyroid are unremarkable. No enlarged lymph nodes seen in the chest. Lungs/Pleura: Central airways are patent. Clustered ground-glass nodules of the right lower lobe and superior portion of the left lower lobe, largest measures 6 mm on series 3, image 110. Additional basilar predominant mild scattered linear opacities which are likely due to scarring or atelectasis. No consolidation, pleural effusion or pneumothorax. Upper Abdomen: No acute abnormality. Musculoskeletal: No chest wall abnormality. No acute or significant osseous findings. IMPRESSION: Clustered ground-glass nodules of the right lower lobe and superior portion of the left lower lobe, largest measures 6 mm. Findings are likely due to infection or aspiration. Non-contrast chest CT at 3-6 months is recommended. If nodules persist, subsequent management will be based upon the most suspicious nodule(s). This recommendation follows the consensus statement: Guidelines for Management of Incidental Pulmonary Nodules Detected on CT Images: From the Fleischner Society 2017; Radiology 2017; 284:228-243. Electronically Signed   By: Allegra Lai M.D.   On: 02/14/2023 13:09   DG Chest 2 View  Result Date: 02/14/2023 CLINICAL DATA:  Cough, fever, and body aches. EXAM: CHEST - 2 VIEW COMPARISON:  12/29/2019 FINDINGS: The  heart size and mediastinal contours are within normal limits. Both lungs are clear. The visualized skeletal structures are unremarkable. IMPRESSION: No active cardiopulmonary disease. Electronically Signed   By: Danae Orleans M.D.   On: 02/14/2023 12:14     Subjective: No acute issues or events overnight denies nausea vomiting diarrhea constipation headache fevers chills chest pain cough or sputum production   Discharge Exam: Vitals:   02/15/23 0247 02/15/23 0623  BP: 112/86 120/86  Pulse: 70 65  Resp: 18 18  Temp: 98.7 F (37.1 C) 98.1 F (36.7 C)  SpO2: 99% 98%   Vitals:   02/14/23 1720 02/14/23 2127 02/15/23 0247 02/15/23 0623  BP: (!) 130/93 132/82 112/86 120/86  Pulse: 91 84 70 65  Resp:  18 18 18   Temp: 98.3 F (36.8 C) 98.3 F (36.8 C) 98.7 F (37.1 C) 98.1 F (36.7 C)  TempSrc: Oral Oral Oral Oral  SpO2: 97% 97% 99% 98%  Weight: 50.8 kg     Height: 5\' 2"  (1.575 m)       General: Pt is alert, awake, not in acute distress Cardiovascular: RRR, S1/S2 +, no rubs, no gallops Respiratory: CTA bilaterally, no wheezing, no rhonchi Abdominal: Soft, NT, ND, bowel sounds + Extremities: no edema, no cyanosis    The results of significant diagnostics from this hospitalization (including imaging, microbiology, ancillary and laboratory) are listed below for reference.     Microbiology: Recent Results (from the past 240 hour(s))  Group A Strep by PCR     Status: None   Collection Time: 02/14/23 11:42 AM   Specimen: Throat; Sterile Swab  Result Value Ref Range Status   Group A Strep by PCR NOT DETECTED NOT DETECTED Final    Comment: Performed at [facility], [address] [place], [place] [address]  Resp panel by RT-PCR (RSV, Flu A&B, Covid) Throat     Status: None   Collection Time: 02/14/23 11:42 AM   Specimen: Throat; Nasal Swab  Result Value Ref Range Status   SARS Coronavirus 2 by RT PCR NEGATIVE NEGATIVE Final    Comment: (NOTE) SARS-CoV-2 target  nucleic acids are NOT DETECTED.  The SARS-CoV-2 RNA is generally detectable in upper respiratory specimens during the acute phase of infection. The lowest concentration of SARS-CoV-2 viral copies this assay can detect is 138 copies/mL. A negative result does not preclude SARS-Cov-2 infection and should not be used as the sole basis for treatment or other patient management decisions. A negative result may occur with  improper specimen collection/handling, submission of specimen other than nasopharyngeal swab, presence of viral mutation(s) within the areas targeted by this assay, and inadequate number of viral copies(<138 copies/mL). A negative result must be combined with clinical observations, patient history, and epidemiological information. The expected result is Negative.  Fact Sheet for Patients:  BloggerCourse.com  Fact Sheet for Healthcare Providers:  SeriousBroker.it  This test is no t yet approved or cleared by the [place] FDA and  has been authorized for detection and/or diagnosis of SARS-CoV-2 by FDA under an Emergency Use Authorization (EUA). This EUA will remain  in effect (meaning this test can be used) for the duration of the COVID-19 declaration under Section 564(b)(1) of the Act, 21 U.S.C.section 360bbb-3(b)(1), unless the authorization is terminated  or revoked sooner.       Influenza A by PCR NEGATIVE NEGATIVE Final   Influenza B by PCR NEGATIVE NEGATIVE Final    Comment: (NOTE) The Xpert Xpress SARS-CoV-2/FLU/RSV plus assay is intended as an aid in the diagnosis of influenza from Nasopharyngeal swab specimens and should not be used as a sole basis for treatment. Nasal washings and aspirates are unacceptable for Xpert Xpress SARS-CoV-2/FLU/RSV testing.  Fact Sheet for Patients: BloggerCourse.com  Fact Sheet for Healthcare  Providers: SeriousBroker.it  This test is not yet approved or cleared by the [place] FDA and has been authorized for detection and/or diagnosis of SARS-CoV-2 by FDA under an Emergency Use Authorization (EUA). This EUA will remain in effect (meaning this test can be used) for the duration of the COVID-19 declaration under Section 564(b)(1) of the Act, 21 U.S.C. section 360bbb-3(b)(1), unless the authorization is terminated or revoked.     Resp Syncytial Virus by PCR NEGATIVE NEGATIVE Final    Comment: (NOTE) Fact Sheet for Patients: BloggerCourse.com  Fact Sheet for Healthcare Providers: SeriousBroker.it  This test is not yet approved or cleared by the [place] FDA and has been authorized for detection and/or diagnosis of SARS-CoV-2 by FDA under an Emergency Use Authorization (EUA). This EUA will remain in effect (meaning this test can be used) for the duration of the COVID-19 declaration under Section 564(b)(1) of the Act, 21 U.S.C. section 360bbb-3(b)(1), unless the authorization is terminated or revoked.  Performed at [facility], [address] [place], [place] [address]   Respiratory (~20 pathogens) panel by PCR     Status: None   Collection Time: 02/14/23  7:08 PM  Specimen: Nasopharyngeal Swab; Respiratory  Result Value Ref Range Status   Adenovirus NOT DETECTED NOT DETECTED Final   Coronavirus 229E NOT DETECTED NOT DETECTED Final    Comment: (NOTE) The Coronavirus on the Respiratory Panel, DOES NOT test for the novel  Coronavirus (2019 nCoV)    Coronavirus HKU1 NOT DETECTED NOT DETECTED Final   Coronavirus NL63 NOT DETECTED NOT DETECTED Final   Coronavirus OC43 NOT DETECTED NOT DETECTED Final   Metapneumovirus NOT DETECTED NOT DETECTED Final   Rhinovirus / Enterovirus NOT DETECTED NOT DETECTED Final   Influenza A NOT DETECTED NOT DETECTED Final   Influenza B NOT  DETECTED NOT DETECTED Final   Parainfluenza Virus 1 NOT DETECTED NOT DETECTED Final   Parainfluenza Virus 2 NOT DETECTED NOT DETECTED Final   Parainfluenza Virus 3 NOT DETECTED NOT DETECTED Final   Parainfluenza Virus 4 NOT DETECTED NOT DETECTED Final   Respiratory Syncytial Virus NOT DETECTED NOT DETECTED Final   Bordetella pertussis NOT DETECTED NOT DETECTED Final   Bordetella Parapertussis NOT DETECTED NOT DETECTED Final   Chlamydophila pneumoniae NOT DETECTED NOT DETECTED Final   Mycoplasma pneumoniae NOT DETECTED NOT DETECTED Final    Comment: Performed at [facility], [address] [place], [place] [address]     Labs: BNP (last 3 results) No results for input(s): "BNP" in the last 8760 hours. Basic Metabolic Panel: Recent Labs  Lab 02/14/23 1142 02/14/23 1809  NA 137  --   K 3.8  --   CL 100  --   CO2 27  --   GLUCOSE 94  --   BUN 10  --   CREATININE 0.78 0.72  CALCIUM 9.5  --    Liver Function Tests: No results for input(s): "AST", "ALT", "ALKPHOS", "BILITOT", "PROT", "ALBUMIN" in the last 168 hours. No results for input(s): "LIPASE", "AMYLASE" in the last 168 hours. No results for input(s): "AMMONIA" in the last 168 hours. CBC: Recent Labs  Lab 02/14/23 1142 02/14/23 1809  WBC 10.9* 8.5  NEUTROABS 7.9*  --   HGB 16.2* 15.5*  HCT 47.3* 47.1*  MCV 91.1 95.7  PLT 312 182   Cardiac Enzymes: No results for input(s): "CKTOTAL", "CKMB", "CKMBINDEX", "TROPONINI" in the last 168 hours. BNP: Invalid input(s): "POCBNP" CBG: No results for input(s): "GLUCAP" in the last 168 hours. D-Dimer No results for input(s): "DDIMER" in the last 72 hours. Hgb A1c No results for input(s): "HGBA1C" in the last 72 hours. Lipid Profile No results for input(s): "CHOL", "HDL", "LDLCALC", "TRIG", "CHOLHDL", "LDLDIRECT" in the last 72 hours. Thyroid function studies No results for input(s): "TSH", "T4TOTAL", "T3FREE", "THYROIDAB" in the last 72 hours.  Invalid input(s):  "FREET3" Anemia work up No results for input(s): "VITAMINB12", "FOLATE", "FERRITIN", "TIBC", "IRON", "RETICCTPCT" in the last 72 hours. Urinalysis    Component Value Date/Time   COLORURINE YELLOW 02/14/2023 1142   APPEARANCEUR CLEAR 02/14/2023 1142   LABSPEC 1.015 02/14/2023 1142   PHURINE 7.0 02/14/2023 1142   GLUCOSEU NEGATIVE 02/14/2023 1142   HGBUR MODERATE (A) 02/14/2023 1142   BILIRUBINUR NEGATIVE 02/14/2023 1142   KETONESUR NEGATIVE 02/14/2023 1142   PROTEINUR NEGATIVE 02/14/2023 1142   NITRITE NEGATIVE 02/14/2023 1142   LEUKOCYTESUR NEGATIVE 02/14/2023 1142   Sepsis Labs Recent Labs  Lab 02/14/23 1142 02/14/23 1809  WBC 10.9* 8.5   Microbiology Recent Results (from the past 240 hour(s))  Group A Strep by PCR     Status: None   Collection Time: 02/14/23 11:42 AM  Specimen: Throat; Sterile Swab  Result Value Ref Range Status   Group A Strep by PCR NOT DETECTED NOT DETECTED Final    Comment: Performed at [facility], [address] [place], [place] [address]  Resp panel by RT-PCR (RSV, Flu A&B, Covid) Throat     Status: None   Collection Time: 02/14/23 11:42 AM   Specimen: Throat; Nasal Swab  Result Value Ref Range Status   SARS Coronavirus 2 by RT PCR NEGATIVE NEGATIVE Final    Comment: (NOTE) SARS-CoV-2 target nucleic acids are NOT DETECTED.  The SARS-CoV-2 RNA is generally detectable in upper respiratory specimens during the acute phase of infection. The lowest concentration of SARS-CoV-2 viral copies this assay can detect is 138 copies/mL. A negative result does not preclude SARS-Cov-2 infection and should not be used as the sole basis for treatment or other patient management decisions. A negative result may occur with  improper specimen collection/handling, submission of specimen other than nasopharyngeal swab, presence of viral mutation(s) within the areas targeted by this assay, and inadequate number of viral copies(<138 copies/mL). A  negative result must be combined with clinical observations, patient history, and epidemiological information. The expected result is Negative.  Fact Sheet for Patients:  BloggerCourse.com  Fact Sheet for Healthcare Providers:  SeriousBroker.it  This test is no t yet approved or cleared by the [place] FDA and  has been authorized for detection and/or diagnosis of SARS-CoV-2 by FDA under an Emergency Use Authorization (EUA). This EUA will remain  in effect (meaning this test can be used) for the duration of the COVID-19 declaration under Section 564(b)(1) of the Act, 21 U.S.C.section 360bbb-3(b)(1), unless the authorization is terminated  or revoked sooner.       Influenza A by PCR NEGATIVE NEGATIVE Final   Influenza B by PCR NEGATIVE NEGATIVE Final    Comment: (NOTE) The Xpert Xpress SARS-CoV-2/FLU/RSV plus assay is intended as an aid in the diagnosis of influenza from Nasopharyngeal swab specimens and should not be used as a sole basis for treatment. Nasal washings and aspirates are unacceptable for Xpert Xpress SARS-CoV-2/FLU/RSV testing.  Fact Sheet for Patients: BloggerCourse.com  Fact Sheet for Healthcare Providers: SeriousBroker.it  This test is not yet approved or cleared by the [place] FDA and has been authorized for detection and/or diagnosis of SARS-CoV-2 by FDA under an Emergency Use Authorization (EUA). This EUA will remain in effect (meaning this test can be used) for the duration of the COVID-19 declaration under Section 564(b)(1) of the Act, 21 U.S.C. section 360bbb-3(b)(1), unless the authorization is terminated or revoked.     Resp Syncytial Virus by PCR NEGATIVE NEGATIVE Final    Comment: (NOTE) Fact Sheet for Patients: BloggerCourse.com  Fact Sheet for Healthcare  Providers: SeriousBroker.it  This test is not yet approved or cleared by the [place] FDA and has been authorized for detection and/or diagnosis of SARS-CoV-2 by FDA under an Emergency Use Authorization (EUA). This EUA will remain in effect (meaning this test can be used) for the duration of the COVID-19 declaration under Section 564(b)(1) of the Act, 21 U.S.C. section 360bbb-3(b)(1), unless the authorization is terminated or revoked.  Performed at [facility], [address] [place], [place] [address]   Respiratory (~20 pathogens) panel by PCR     Status: None   Collection Time: 02/14/23  7:08 PM   Specimen: Nasopharyngeal Swab; Respiratory  Result Value Ref Range Status   Adenovirus NOT DETECTED NOT DETECTED Final  Coronavirus 229E NOT DETECTED NOT DETECTED Final    Comment: (NOTE) The Coronavirus on the Respiratory Panel, DOES NOT test for the novel  Coronavirus (2019 nCoV)    Coronavirus HKU1 NOT DETECTED NOT DETECTED Final   Coronavirus NL63 NOT DETECTED NOT DETECTED Final   Coronavirus OC43 NOT DETECTED NOT DETECTED Final   Metapneumovirus NOT DETECTED NOT DETECTED Final   Rhinovirus / Enterovirus NOT DETECTED NOT DETECTED Final   Influenza A NOT DETECTED NOT DETECTED Final   Influenza B NOT DETECTED NOT DETECTED Final   Parainfluenza Virus 1 NOT DETECTED NOT DETECTED Final   Parainfluenza Virus 2 NOT DETECTED NOT DETECTED Final   Parainfluenza Virus 3 NOT DETECTED NOT DETECTED Final   Parainfluenza Virus 4 NOT DETECTED NOT DETECTED Final   Respiratory Syncytial Virus NOT DETECTED NOT DETECTED Final   Bordetella pertussis NOT DETECTED NOT DETECTED Final   Bordetella Parapertussis NOT DETECTED NOT DETECTED Final   Chlamydophila pneumoniae NOT DETECTED NOT DETECTED Final   Mycoplasma pneumoniae NOT DETECTED NOT DETECTED Final    Comment: Performed at [facility], [address], [place], [place] [address]     Time  coordinating discharge: Over 30 minutes  SIGNED:   Azucena Fallen, DO [facility] 02/15/2023, 1:10 PM Pager   If 7PM-7AM, please contact night-coverage [email or web address]

## 2023-02-17 ENCOUNTER — Telehealth: Payer: Self-pay

## 2023-02-17 LAB — LEGIONELLA PNEUMOPHILA SEROGP 1 UR AG: L. pneumophila Serogp 1 Ur Ag: NEGATIVE

## 2023-02-17 NOTE — Transitions of Care (Post Inpatient/ED Visit) (Signed)
02/17/2023  Name: Lindsay Griffin MRN: [id number] DOB: 12-10-1975  Today's TOC FU Call Status: Today's TOC FU Call Status:: Successful TOC FU Call Competed TOC FU Call Complete Date: 02/17/23  Transition Care Management Follow-up Telephone Call Date of Discharge: 02/15/23 Discharge Facility: [facility] Type of Discharge: Inpatient Admission Primary Inpatient Discharge Diagnosis:: Rheumatoid arthritis How have you been since you were released from the hospital?: Same Any questions or concerns?: No  Items Reviewed: Did you receive and understand the discharge instructions provided?: Yes Medications obtained,verified, and reconciled?: Yes (Medications Reviewed) Any new allergies since your discharge?: No Dietary orders reviewed?: NA Do you have support at home?: Yes  Medications Reviewed Today: Medications Reviewed Today     Reviewed by Merleen Nicely, LPN (Licensed Practical Nurse) on 02/17/23 at 1141  Med List Status: <None>   Medication Order Taking? Sig Documenting Provider Last Dose Status Informant  diclofenac (VOLTAREN) 75 MG EC tablet [id number] Yes Take 75 mg by mouth as needed for mild pain. [provider] Taking Active Self, Pharmacy Records           Med Note [facility] Renee Rival A   Fri Feb 14, 2023  1:51 PM)    fexofenadine (ALLEGRA) 180 MG tablet [id number] Yes Take 180 mg by mouth daily. Pt unsure of dosage. [provider] Taking Active Self, Pharmacy Records  fluticasone [facility]) 50 MCG/ACT nasal spray [id number] Yes Place 1 spray into both nostrils daily. [provider] Taking Active Self, Pharmacy Records           Med Note (KNAPP, EVE   Thu Apr 11, 2022  9:30 AM) Sensimist  golimumab (SIMPONI ARIA) 50 MG/4ML SOLN injection [id number] Yes Inject into the vein See admin instructions. Every other month. [provider] Taking Active Self, Pharmacy Records           Med Note [facility] Renee Rival A   Fri Feb 14, 2023   1:55 PM)    Olopatadine HCl (PATADAY OP) [id number] Yes Place 1 drop into both eyes as needed (allergies). [provider] Taking Active Self, Pharmacy Records           Med Note ([employer] [place] A   Fri Feb 14, 2023  1:54 PM)    ondansetron (ZOFRAN) 4 MG tablet [id number] Yes Take 1 tablet (4 mg total) by mouth every 8 (eight) hours as needed for nausea or vomiting. Early, Sung Amabile, NP Taking Active Self, Pharmacy Records  polyethylene glycol (MIRALAX / GLYCOLAX) 17 g packet [id number] Yes Take 17 g by mouth daily. [provider] Taking Active Self, Pharmacy Records           Med Note [facility] Renee Rival A   Fri Feb 14, 2023  1:55 PM)    Probiotic Product (PROBIOTIC-10 PO) [id number] Yes Take 1 capsule by mouth daily. Women's health probiotic from [employer] [provider] Taking Active Self, Pharmacy Records  ZINC-VITAMIN C [place] [id number] Yes Take 1 tablet by mouth daily. Chewables [provider] Taking Active Self, Pharmacy Records            Home Care and Equipment/Supplies: Were Home Health Services Ordered?: No Any new equipment or medical supplies ordered?: No  Functional Questionnaire: Do you need assistance with bathing/showering or dressing?: No Do you need assistance with meal preparation?: No Do you need assistance with eating?: No Do you have difficulty maintaining continence: No Do you need assistance with getting out of bed/getting out of  a chair/moving?: No Do you have difficulty managing or taking your medications?: No  Follow up appointments reviewed: PCP Follow-up appointment confirmed?: No (pt is trying to see rheumatology by 03-01-23 - if not will call and make fu appt with PCP) MD Provider Line Number:[phone] Given: Yes Specialist Hospital Follow-up appointment confirmed?: No (pt is calling to make fu appt with rheumatology) Reason Specialist Follow-Up Not Confirmed: Patient has Specialist Provider Number and will Call  for Appointment Do you need transportation to your follow-up appointment?: No Do you understand care options if your condition(s) worsen?: Yes-patient verbalized understanding    SIGNATURE  Woodfin Ganja LPN [facility] Nurse Health Advisor Direct Dial [phone]

## 2023-02-18 ENCOUNTER — Telehealth: Payer: Self-pay | Admitting: Internal Medicine

## 2023-02-18 NOTE — Telephone Encounter (Signed)
Pt called and wanted to let you know that she has rheumatology appointment tomorrow

## 2023-03-09 ENCOUNTER — Encounter: Payer: Self-pay | Admitting: Nurse Practitioner

## 2023-04-17 ENCOUNTER — Encounter: Payer: 59 | Admitting: Nurse Practitioner

## 2023-05-22 ENCOUNTER — Encounter: Payer: Self-pay | Admitting: Nurse Practitioner

## 2023-05-22 ENCOUNTER — Ambulatory Visit (INDEPENDENT_AMBULATORY_CARE_PROVIDER_SITE_OTHER): Payer: 59 | Admitting: Nurse Practitioner

## 2023-05-22 VITALS — BP 114/76 | HR 76 | Ht 62.0 in | Wt 111.6 lb

## 2023-05-22 DIAGNOSIS — Z0000 Encounter for general adult medical examination without abnormal findings: Secondary | ICD-10-CM | POA: Diagnosis not present

## 2023-05-22 DIAGNOSIS — R911 Solitary pulmonary nodule: Secondary | ICD-10-CM

## 2023-05-22 DIAGNOSIS — L4050 Arthropathic psoriasis, unspecified: Secondary | ICD-10-CM | POA: Diagnosis not present

## 2023-05-22 DIAGNOSIS — M059 Rheumatoid arthritis with rheumatoid factor, unspecified: Secondary | ICD-10-CM

## 2023-05-22 DIAGNOSIS — K5904 Chronic idiopathic constipation: Secondary | ICD-10-CM

## 2023-05-22 DIAGNOSIS — N952 Postmenopausal atrophic vaginitis: Secondary | ICD-10-CM | POA: Insufficient documentation

## 2023-05-22 DIAGNOSIS — Z23 Encounter for immunization: Secondary | ICD-10-CM | POA: Diagnosis not present

## 2023-05-22 NOTE — Assessment & Plan Note (Signed)
She has recently made the decision to stop biologics due to reduced immunity and frequent infections. She saw Dr Maple Hudson with [facility] rheumatology today. We will continue to monitor and provide supportive care.

## 2023-05-22 NOTE — Progress Notes (Signed)
Lindsay Clamp, DNP, AGNP-c [facility] [address] [place], [place] [address] Main Office [phone]  BP 114/76   Pulse 76   Ht 5\' 2"  (1.575 m)   Wt 111 lb 9.6 oz (50.6 kg)   LMP  (LMP Unknown) Comment: every 6 weeks. told she was premenopausal  SpO2 96%   BMI 20.41 kg/m    Subjective:    Patient ID: Lindsay Griffin, female    DOB: 09-Apr-1976, 47 y.o.   MRN: [id number]  HPI: Lindsay Griffin is a 47 y.o. female presenting on 05/22/2023 for comprehensive medical examination.   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 She has been started on topical estrogen and is tolerating well.   She has stopped all of her biologics for RA due to increased illnesses in recent months. She would like to see how this goes.   She mentions she was seen in the ED for pneumonia after being started on antibiotics in our office. In the ED a CT was performed and she was admitted for IV antibiotics. She reports the following morning she was told by the attending physician that she did not have pneumonia and she should not have been admitted and she was discharged. She reports confusion over the entire episode. She tells me that now she has received notice from her insurance company that they are not going to cover her medical bills due to lack of justification for the hospital stay.   Pertinent items are noted in HPI.  IMMUNIZATIONS:   Flu: Flu vaccine given today Prevnar 13: Prevnar 13 N/A for this patient Prevnar 20: Prevnar 20 N/A for this patient Pneumovax 23: Pneumovax 23 N/A for this patient Vac Shingrix: Shingrix N/A for this patient HPV: HPV N/A for this patient Tetanus: Tetanus declined, patient will complete at a later date COVID: COVID declined, patient will complete at a later date  HEALTH MAINTENANCE: Pap Smear HM Status: is up to date Mammogram HM  Status: is up to date Colon Cancer Screening HM Status: is up to date Bone Density HM Status: is not applicable for this patient STI Testing HM Status: was declined  Lung CT HM Status: evaluation of last CT in May shows repeat needed in October-November  She denies concerns with her vision, hearing, or dentition.  She consumes a regular diet.   Most Recent Depression Screen:     05/22/2023   10:43 AM 01/15/2022    9:47 AM 01/04/2021    9:46 AM 01/04/2021    9:45 AM 12/29/2019    9:07 AM  Depression screen PHQ 2/9  Decreased Interest 0 0 0 0 0  Down, Depressed, Hopeless 0 0 0 0 0  PHQ - 2 Score 0 0 0 0 0  Altered sleeping   0    Tired, decreased energy   0    Change in appetite   0    Feeling bad or failure about yourself    0    Trouble concentrating   0    Moving slowly or fidgety/restless   0    Suicidal thoughts   0    PHQ-9 Score   0    Difficult doing work/chores   Not difficult at all     Most Recent Anxiety Screen:      No data to display         Most Recent Fall Screen:  05/22/2023   10:42 AM 01/15/2022    9:46 AM 01/04/2021    9:45 AM 12/29/2019    9:07 AM 06/09/2019   11:18 AM  Fall Risk   Falls in the past year? 0 0 0 0 0  Number falls in past yr: 0 0 0 0 0  Injury with Fall? 0 0 0 0 0  Risk for fall due to :  No Fall Risks No Fall Risks    Follow up  Falls evaluation completed Falls evaluation completed      Past medical history, surgical history, medications, allergies, family history and social history reviewed with patient today and changes made to appropriate areas of the chart.  Past Medical History:  Past Medical History:  Diagnosis Date   Abnormal Pap smear    Acute cough 02/12/2023   Acute non-recurrent pansinusitis 12/20/2022   Acute upper respiratory infections of unspecified site    AMA (advanced maternal age) multigravida 35+    Anal fissure 08/13/2017   Anemia    per pt, several years ago as of 08/13/21   Anxiety    Arthritis    Body  mass index (BMI) 21.0-21.9, adult 01/15/2022   CAP (community acquired pneumonia) 02/14/2023   Elevated LDL cholesterol level 12/30/2019   External hemorrhoids without mention of complication    Fever 02/12/2023   Flu-like symptoms 01/23/2023   Ganglion cyst 2022   right dorsal carpal ganglion cyst   H/O varicella    as a child   Lichen sclerosus    Lichen sclerosus 01/15/2022   Lichen sclerosus of vulva 01/15/2022   Major depressive disorder, single episode, severe without psychotic features (HCC)    Mastodynia    Non-recurrent acute suppurative otitis media of left ear without spontaneous rupture of tympanic membrane 02/12/2023   Other specified visual disturbances    with pregnancy   Psoriasis    Psoriatic arthritis (HCC)    manged by Dr. Wallace Cullens   Rectal bleeding 08/13/2017   Recurrent major depression (HCC) 09/03/2014   Retention of urine, unspecified    Rubella without mention of complication    unsure if measles or allergic reaction to sulfa   Scleritis 05/14/2021   right eye   Severe major depression without psychotic features (HCC) 09/03/2014   Medications:  Current Outpatient Medications on File Prior to Visit  Medication Sig   conjugated estrogens (PREMARIN) 25 MG injection Inject 25 mg into the vein 2 (two) times a week.   fexofenadine (ALLEGRA) 180 MG tablet Take 180 mg by mouth daily. Pt unsure of dosage.   Polyethylene Glycol 3350 (MIRALAX PO)    No current facility-administered medications on file prior to visit.   Surgical History:  Past Surgical History:  Procedure Laterality Date   BREAST BIOPSY Left 04/28/2017   COLONOSCOPY  10/03/2017   CRYOTHERAPY  1994   cryotherapy of vaginal lesion   DILATION AND CURETTAGE OF UTERUS  07/17/2010   FOOT SURGERY     R x2   GANGLION CYST EXCISION Right 08/16/2021   Procedure: Right dorsal carpal ganglion cyst excision;  Surgeon: Gomez Cleverly, MD;  Location: [facility];  Service: Orthopedics;   Laterality: Right;  with local anesthesia   HEMORRHOID BANDING  01/07/2018   LASIK     SEPTOPLASTY  2019   Allergies:  Allergies  Allergen Reactions   Sulfa Antibiotics Hives, Rash and Other (See Comments)    As a child; Occurred in Lindsay Griffin Childhood    Family History:  Family History  Problem Relation Age of Onset   Pancreatitis Father    Diabetes Mother    Cancer Mother        cervical   Hypertension Mother    Colon polyps Mother    Stroke Maternal Grandmother    Heart disease Maternal Grandmother    Cancer Maternal Grandfather        colon   Colon cancer Maternal Grandfather    Diabetes Paternal Grandfather    Breast cancer Neg Hx        Objective:    BP 114/76   Pulse 76   Ht 5\' 2"  (1.575 m)   Wt 111 lb 9.6 oz (50.6 kg)   LMP  (LMP Unknown) Comment: every 6 weeks. told she was premenopausal  SpO2 96%   BMI 20.41 kg/m   Wt Readings from Last 3 Encounters:  05/22/23 111 lb 9.6 oz (50.6 kg)  02/14/23 112 lb (50.8 kg)  01/27/23 112 lb 12.8 oz (51.2 kg)    Physical Exam Vitals and nursing note reviewed.  Constitutional:      Appearance: Normal appearance. She is normal weight.  HENT:     Head: Normocephalic and atraumatic.     Right Ear: Tympanic membrane normal.     Left Ear: Tympanic membrane normal.     Nose: Nose normal.     Mouth/Throat:     Mouth: Mucous membranes are moist.     Pharynx: Oropharynx is clear.  Eyes:     Extraocular Movements: Extraocular movements intact.     Conjunctiva/sclera: Conjunctivae normal.     Pupils: Pupils are equal, round, and reactive to light.  Neck:     Vascular: No carotid bruit.  Cardiovascular:     Rate and Rhythm: Normal rate and regular rhythm.     Pulses: Normal pulses.     Heart sounds: Normal heart sounds. No murmur heard. Pulmonary:     Effort: Pulmonary effort is normal.     Breath sounds: Normal breath sounds.  Abdominal:     General: Abdomen is flat. Bowel sounds are normal. There is no distension.      Palpations: Abdomen is soft.     Tenderness: There is abdominal tenderness in the epigastric area. There is no right CVA tenderness, left CVA tenderness, guarding or rebound.  Musculoskeletal:        General: Normal range of motion.     Cervical back: Normal range of motion. No tenderness.     Right lower leg: No edema.     Left lower leg: No edema.  Skin:    General: Skin is warm and dry.     Capillary Refill: Capillary refill takes less than 2 seconds.  Neurological:     Mental Status: She is alert and oriented to person, place, and time.     Motor: No weakness.     Coordination: Coordination normal.     Gait: Gait normal.  Psychiatric:        Mood and Affect: Mood normal.        Behavior: Behavior normal.     Results for orders placed or performed during the hospital encounter of 02/14/23  Group A Strep by PCR   Specimen: Throat; Sterile Swab  Result Value Ref Range   Group A Strep by PCR NOT DETECTED NOT DETECTED  Resp panel by RT-PCR (RSV, Flu A&B, Covid) Throat   Specimen: Throat; Nasal Swab  Result Value Ref Range   SARS Coronavirus 2 by RT  PCR NEGATIVE NEGATIVE   Influenza A by PCR NEGATIVE NEGATIVE   Influenza B by PCR NEGATIVE NEGATIVE   Resp Syncytial Virus by PCR NEGATIVE NEGATIVE  Respiratory (~20 pathogens) panel by PCR   Specimen: Nasopharyngeal Swab; Respiratory  Result Value Ref Range   Adenovirus NOT DETECTED NOT DETECTED   Coronavirus 229E NOT DETECTED NOT DETECTED   Coronavirus HKU1 NOT DETECTED NOT DETECTED   Coronavirus NL63 NOT DETECTED NOT DETECTED   Coronavirus OC43 NOT DETECTED NOT DETECTED   Metapneumovirus NOT DETECTED NOT DETECTED   Rhinovirus / Enterovirus NOT DETECTED NOT DETECTED   Influenza A NOT DETECTED NOT DETECTED   Influenza B NOT DETECTED NOT DETECTED   Parainfluenza Virus 1 NOT DETECTED NOT DETECTED   Parainfluenza Virus 2 NOT DETECTED NOT DETECTED   Parainfluenza Virus 3 NOT DETECTED NOT DETECTED   Parainfluenza Virus 4 NOT  DETECTED NOT DETECTED   Respiratory Syncytial Virus NOT DETECTED NOT DETECTED   Bordetella pertussis NOT DETECTED NOT DETECTED   Bordetella Parapertussis NOT DETECTED NOT DETECTED   Chlamydophila pneumoniae NOT DETECTED NOT DETECTED   Mycoplasma pneumoniae NOT DETECTED NOT DETECTED  Urinalysis, Routine w reflex microscopic -Urine, Clean Catch  Result Value Ref Range   Color, Urine YELLOW YELLOW   APPearance CLEAR CLEAR   Specific Gravity, Urine 1.015 1.005 - 1.030   pH 7.0 5.0 - 8.0   Glucose, UA NEGATIVE NEGATIVE mg/dL   Hgb urine dipstick MODERATE (A) NEGATIVE   Bilirubin Urine NEGATIVE NEGATIVE   Ketones, ur NEGATIVE NEGATIVE mg/dL   Protein, ur NEGATIVE NEGATIVE mg/dL   Nitrite NEGATIVE NEGATIVE   Leukocytes,Ua NEGATIVE NEGATIVE  CBC with Differential  Result Value Ref Range   WBC 10.9 (H) 4.0 - 10.5 K/uL   RBC 5.19 (H) 3.87 - 5.11 MIL/uL   Hemoglobin 16.2 (H) 12.0 - 15.0 g/dL   HCT 40.9 (H) 81.1 - 91.4 %   MCV 91.1 80.0 - 100.0 fL   MCH 31.2 26.0 - 34.0 pg   MCHC 34.2 30.0 - 36.0 g/dL   RDW 78.2 95.6 - 21.3 %   Platelets 312 150 - 400 K/uL   nRBC 0.0 0.0 - 0.2 %   Neutrophils Relative % 72 %   Neutro Abs 7.9 (H) 1.7 - 7.7 K/uL   Lymphocytes Relative 17 %   Lymphs Abs 1.8 0.7 - 4.0 K/uL   Monocytes Relative 8 %   Monocytes Absolute 0.9 0.1 - 1.0 K/uL   Eosinophils Relative 3 %   Eosinophils Absolute 0.3 0.0 - 0.5 K/uL   Basophils Relative 0 %   Basophils Absolute 0.0 0.0 - 0.1 K/uL   Immature Granulocytes 0 %   Abs Immature Granulocytes 0.03 0.00 - 0.07 K/uL  Basic metabolic panel  Result Value Ref Range   Sodium 137 135 - 145 mmol/L   Potassium 3.8 3.5 - 5.1 mmol/L   Chloride 100 98 - 111 mmol/L   CO2 27 22 - 32 mmol/L   Glucose, Bld 94 70 - 99 mg/dL   BUN 10 6 - 20 mg/dL   Creatinine, Ser 0.86 0.44 - 1.00 mg/dL   Calcium 9.5 8.9 - 57.8 mg/dL   GFR, Estimated >46 >96 mL/min   Anion gap 10 5 - 15  Urinalysis, Microscopic (reflex)  Result Value Ref Range    RBC / HPF 0-5 0 - 5 RBC/hpf   WBC, UA NONE SEEN 0 - 5 WBC/hpf   Bacteria, UA RARE (A) NONE SEEN   Squamous Epithelial /  HPF 0-5 0 - 5 /HPF  HIV Antibody (routine testing w rflx)  Result Value Ref Range   HIV Screen 4th Generation wRfx Non Reactive Non Reactive  CBC  Result Value Ref Range   WBC 8.5 4.0 - 10.5 K/uL   RBC 4.92 3.87 - 5.11 MIL/uL   Hemoglobin 15.5 (H) 12.0 - 15.0 g/dL   HCT 40.9 (H) 81.1 - 91.4 %   MCV 95.7 80.0 - 100.0 fL   MCH 31.5 26.0 - 34.0 pg   MCHC 32.9 30.0 - 36.0 g/dL   RDW 78.2 95.6 - 21.3 %   Platelets 182 150 - 400 K/uL   nRBC 0.0 0.0 - 0.2 %  Creatinine, serum  Result Value Ref Range   Creatinine, Ser 0.72 0.44 - 1.00 mg/dL   GFR, Estimated >08 >65 mL/min  Strep pneumoniae urinary antigen  Result Value Ref Range   Strep Pneumo Urinary Antigen NEGATIVE NEGATIVE  Legionella Pneumophila Serogp 1 Ur Ag  Result Value Ref Range   L. pneumophila Serogp 1 Ur Ag Negative Negative   Source of Sample URINE, CLEAN CATCH          Assessment & Plan:   Problem List Items Addressed This Visit     Psoriatic arthritis (HCC)    She has recently made the decision to stop biologics due to reduced immunity and frequent infections. She saw Dr Maple Hudson with [facility] rheumatology today. We will continue to monitor and provide supportive care.       Relevant Orders   Hemoglobin A1c   CBC with Differential/Platelet   Comprehensive metabolic panel   Lipid panel   VITAMIN D 25 Hydroxy (Vit-D Deficiency, Fractures)   TSH   Chronic idiopathic constipation    Currently managing with miralax daily. No alarm symptoms are present at this time. Recommend continuation of current therapy as this has been effective.       Relevant Medications   Polyethylene Glycol 3350 (MIRALAX PO)   Other Relevant Orders   Hemoglobin A1c   CBC with Differential/Platelet   Comprehensive metabolic panel   Lipid panel   VITAMIN D 25 Hydroxy (Vit-D Deficiency, Fractures)   TSH   Rheumatoid  arthritis (HCC)    Chronic. Monitored by Dr. Maple Hudson. Currently stable with some hip pain present. She has recently stopped biologics due to increased illness/infection. She would like to see about managing off of this medication. We will monitor.       Relevant Orders   Hemoglobin A1c   CBC with Differential/Platelet   Comprehensive metabolic panel   Lipid panel   VITAMIN D 25 Hydroxy (Vit-D Deficiency, Fractures)   TSH   Encounter for annual physical exam - Primary    CPE completed today. Review of HM activities and recommendations discussed and provided on AVS. Anticipatory guidance, diet, and exercise recommendations provided. Medications, allergies, and hx reviewed and updated as necessary. Orders placed as listed below.  Plan: - Labs ordered. Will make changes as necessary based on results.  - I will review these results and send recommendations via MyChart or a telephone call.  - F/U with CPE in 1 year or sooner for acute/chronic health needs as directed.        Relevant Orders   Hemoglobin A1c   CBC with Differential/Platelet   Comprehensive metabolic panel   Lipid panel   VITAMIN D 25 Hydroxy (Vit-D Deficiency, Fractures)   TSH   Vaginal atrophy    New diagnosis with UroGyn. Currently managing with premarin  cream with no concerning symptoms. Will monitor closely. OK to refill premarin as needed for her.       Relevant Orders   Hemoglobin A1c   CBC with Differential/Platelet   Comprehensive metabolic panel   Lipid panel   VITAMIN D 25 Hydroxy (Vit-D Deficiency, Fractures)   TSH   Incidental lung nodule, > 3mm and < 8mm    6 mm ground glass nodule noted on CT from 02/14/2023. Recommendations were for follow-up in 3-6 months with non contrast CT to ensure this was infectious/inflammatory in nature. Will order repeat CT today.       Relevant Orders   CT CHEST NODULE FOLLOW UP LOW DOSE W/O   Other Visit Diagnoses     Need for influenza vaccination       Relevant  Orders   Flu vaccine trivalent PF, 6mos and older(Flulaval,Afluria,Fluarix,Fluzone) (Completed)   Hemoglobin A1c   CBC with Differential/Platelet   Comprehensive metabolic panel   Lipid panel   VITAMIN D 25 Hydroxy (Vit-D Deficiency, Fractures)   TSH   Health care maintenance       Relevant Orders   Hemoglobin A1c   CBC with Differential/Platelet   Comprehensive metabolic panel   Lipid panel   VITAMIN D 25 Hydroxy (Vit-D Deficiency, Fractures)   TSH          Follow up plan: No follow-ups on file.  NEXT PREVENTATIVE PHYSICAL DUE IN 1 YEAR.  PATIENT COUNSELING PROVIDED FOR ALL ADULT PATIENTS: A well balanced diet low in saturated fats, cholesterol, and moderation in carbohydrates.  This can be as simple as monitoring portion sizes and cutting back on sugary beverages such as soda and juice to start with.    Daily water consumption of at least 64 ounces.  Physical activity at least 180 minutes per week.  If just starting out, start 10 minutes a day and work your way up.   This can be as simple as taking the stairs instead of the elevator and walking 2-3 laps around the office  purposefully every day.   STD protection, partner selection, and regular testing if high risk.  Limited consumption of alcoholic beverages if alcohol is consumed. For men, I recommend no more than 14 alcoholic beverages per week, spread out throughout the week (max 2 per day). Avoid "binge" drinking or consuming large quantities of alcohol in one setting.  Please let me know if you feel you may need help with reduction or quitting alcohol consumption.   Avoidance of nicotine, if used. Please let me know if you feel you may need help with reduction or quitting nicotine use.   Daily mental health attention. This can be in the form of 5 minute daily meditation, prayer, journaling, yoga, reflection, etc.  Purposeful attention to your emotions and mental state can significantly improve your overall  wellbeing  and  Health.  Please know that I am here to help you with all of your health care goals and am happy to work with you to find a solution that works best for you.  The greatest advice I have received with any changes in life are to take it one step at a time, that even means if all you can focus on is the next 60 seconds, then do that and celebrate your victories.  With any changes in life, you will have set backs, and that is OK. The important thing to remember is, if you have a set back, it is not a failure, it  is an opportunity to try again! Screening Testing Mammogram Every 1 -2 years based on history and risk factors Starting at age 28 Pap Smear Ages 21-39 every 3 years Ages 75-65 every 5 years with HPV testing More frequent testing may be required based on results and history Colon Cancer Screening Every 1-10 years based on test performed, risk factors, and history Starting at age 29 Bone Density Screening Every 2-10 years based on history Starting at age 87 for women Recommendations for men differ based on medication usage, history, and risk factors AAA Screening One time ultrasound Men 39-62 years old who have every smoked Lung Cancer Screening Low Dose Lung CT every 12 months Age 27-80 years with a 30 pack-year smoking history who still smoke or who have quit within the last 15 years   Screening Labs Routine  Labs: Complete Blood Count (CBC), Complete Metabolic Panel (CMP), Cholesterol (Lipid Panel) Every 6-12 months based on history and medications May be recommended more frequently based on current conditions or previous results Hemoglobin A1c Lab Every 3-12 months based on history and previous results Starting at age 59 or earlier with diagnosis of diabetes, high cholesterol, BMI >26, and/or risk factors Frequent monitoring for patients with diabetes to ensure blood sugar control Thyroid Panel (TSH) Every 6 months based on history, symptoms, and risk  factors May be repeated more often if on medication HIV One time testing for all patients 12 and older May be repeated more frequently for patients with increased risk factors or exposure Hepatitis C One time testing for all patients 66 and older May be repeated more frequently for patients with increased risk factors or exposure Gonorrhea, Chlamydia Every 12 months for all sexually active persons 13-24 years Additional monitoring may be recommended for those who are considered high risk or who have symptoms Every 12 months for any woman on birth control, regardless of sexual activity PSA Men 44-58 years old with risk factors Additional screening may be recommended from age 2-69 based on risk factors, symptoms, and history  Vaccine Recommendations Tetanus Booster All adults every 10 years Flu Vaccine All patients 6 months and older every year COVID Vaccine All patients 12 years and older Initial dosing with booster May recommend additional booster based on age and health history HPV Vaccine 2 doses all patients age 96-26 Dosing may be considered for patients over 26 Shingles Vaccine (Shingrix) 2 doses all adults 55 years and older Pneumonia (Pneumovax 84) All adults 65 years and older May recommend earlier dosing based on health history One year apart from Prevnar 64 Pneumonia (Prevnar 16) All adults 65 years and older Dosed 1 year after Pneumovax 23 Pneumonia (Prevnar 20) One time alternative to the two dosing of 13 and 23 For all adults with initial dose of 23, 20 is recommended 1 year later For all adults with initial dose of 13, 23 is still recommended as second option 1 year later

## 2023-05-22 NOTE — Assessment & Plan Note (Signed)
6 mm ground glass nodule noted on CT from 02/14/2023. Recommendations were for follow-up in 3-6 months with non contrast CT to ensure this was infectious/inflammatory in nature. Will order repeat CT today.

## 2023-05-22 NOTE — Assessment & Plan Note (Signed)
New diagnosis with UroGyn. Currently managing with premarin cream with no concerning symptoms. Will monitor closely. OK to refill premarin as needed for her.

## 2023-05-22 NOTE — Assessment & Plan Note (Signed)

## 2023-05-22 NOTE — Assessment & Plan Note (Addendum)
Currently managing with miralax daily. No alarm symptoms are present at this time. Recommend continuation of current therapy as this has been effective.

## 2023-05-22 NOTE — Assessment & Plan Note (Signed)
Chronic. Monitored by Dr. Maple Hudson. Currently stable with some hip pain present. She has recently stopped biologics due to increased illness/infection. She would like to see about managing off of this medication. We will monitor.

## 2023-05-23 LAB — CBC WITH DIFFERENTIAL/PLATELET
Basophils Absolute: 0 10*3/uL (ref 0.0–0.2)
Basos: 1 %
EOS (ABSOLUTE): 0.2 10*3/uL (ref 0.0–0.4)
Eos: 2 %
Hematocrit: 47 % — ABNORMAL HIGH (ref 34.0–46.6)
Hemoglobin: 15.3 g/dL (ref 11.1–15.9)
Immature Grans (Abs): 0 10*3/uL (ref 0.0–0.1)
Immature Granulocytes: 0 %
Lymphocytes Absolute: 2.2 10*3/uL (ref 0.7–3.1)
Lymphs: 26 %
MCH: 30.5 pg (ref 26.6–33.0)
MCHC: 32.6 g/dL (ref 31.5–35.7)
MCV: 94 fL (ref 79–97)
Monocytes Absolute: 0.7 10*3/uL (ref 0.1–0.9)
Monocytes: 8 %
Neutrophils Absolute: 5.5 10*3/uL (ref 1.4–7.0)
Neutrophils: 63 %
Platelets: 323 10*3/uL (ref 150–450)
RBC: 5.02 x10E6/uL (ref 3.77–5.28)
RDW: 12.1 % (ref 11.7–15.4)
WBC: 8.7 10*3/uL (ref 3.4–10.8)

## 2023-05-23 LAB — COMPREHENSIVE METABOLIC PANEL
ALT: 13 IU/L (ref 0–32)
AST: 19 IU/L (ref 0–40)
Albumin: 4.5 g/dL (ref 3.9–4.9)
Alkaline Phosphatase: 78 IU/L (ref 44–121)
BUN/Creatinine Ratio: 13 (ref 9–23)
BUN: 8 mg/dL (ref 6–24)
Bilirubin Total: 0.4 mg/dL (ref 0.0–1.2)
CO2: 24 mmol/L (ref 20–29)
Calcium: 9.7 mg/dL (ref 8.7–10.2)
Chloride: 101 mmol/L (ref 96–106)
Creatinine, Ser: 0.62 mg/dL (ref 0.57–1.00)
Globulin, Total: 2.4 g/dL (ref 1.5–4.5)
Glucose: 79 mg/dL (ref 70–99)
Potassium: 4.6 mmol/L (ref 3.5–5.2)
Sodium: 139 mmol/L (ref 134–144)
Total Protein: 6.9 g/dL (ref 6.0–8.5)
eGFR: 111 mL/min/{1.73_m2} (ref >59)

## 2023-05-23 LAB — LIPID PANEL
Chol/HDL Ratio: 2.4 ratio (ref 0.0–4.4)
Cholesterol, Total: 184 mg/dL (ref 100–199)
HDL: 78 mg/dL (ref >39)
LDL Chol Calc (NIH): 93 mg/dL (ref 0–99)
Triglycerides: 71 mg/dL (ref 0–149)
VLDL Cholesterol Cal: 13 mg/dL (ref 5–40)

## 2023-05-23 LAB — TSH: TSH: 1.07 u[IU]/mL (ref 0.450–4.500)

## 2023-05-23 LAB — VITAMIN D 25 HYDROXY (VIT D DEFICIENCY, FRACTURES): Vit D, 25-Hydroxy: 49.2 ng/mL (ref 30.0–100.0)

## 2023-05-23 LAB — HEMOGLOBIN A1C
Est. average glucose Bld gHb Est-mCnc: 105 mg/dL
Hgb A1c MFr Bld: 5.3 % (ref 4.8–5.6)

## 2023-06-06 ENCOUNTER — Ambulatory Visit
Admission: RE | Admit: 2023-06-06 | Discharge: 2023-06-06 | Disposition: A | Discharge status: Home / Self Care | Payer: 59 | Source: Ambulatory Visit | Attending: Nurse Practitioner | Admitting: Nurse Practitioner

## 2023-06-06 DIAGNOSIS — R911 Solitary pulmonary nodule: Secondary | ICD-10-CM

## 2023-09-04 ENCOUNTER — Ambulatory Visit: Payer: 59 | Admitting: Medical

## 2023-09-04 VITALS — BP 110/70 | HR 100 | Temp 97.9°F | Wt 114.0 lb

## 2023-09-04 DIAGNOSIS — F17200 Nicotine dependence, unspecified, uncomplicated: Secondary | ICD-10-CM

## 2023-09-04 DIAGNOSIS — R059 Cough, unspecified: Secondary | ICD-10-CM | POA: Diagnosis not present

## 2023-09-04 DIAGNOSIS — J988 Other specified respiratory disorders: Secondary | ICD-10-CM | POA: Diagnosis not present

## 2023-09-04 DIAGNOSIS — Z8709 Personal history of other diseases of the respiratory system: Secondary | ICD-10-CM

## 2023-09-04 LAB — POCT INFLUENZA A/B
Influenza A, POC: NEGATIVE
Influenza B, POC: NEGATIVE

## 2023-09-04 LAB — POC COVID19 BINAXNOW: SARS Coronavirus 2 Ag: NEGATIVE

## 2023-09-04 MED ORDER — HYDROCODONE BIT-HOMATROP MBR 5-1.5 MG/5ML PO SOLN: 5.0000 mL | Freq: Three times a day (TID) | ORAL | 0 refills | Status: AC | PRN

## 2023-09-04 MED ORDER — BUPROPION HCL ER (XL) 150 MG PO TB24: 150.0000 mg | ORAL_TABLET | Freq: Every day | ORAL | 0 refills | Status: DC

## 2023-09-04 MED ORDER — AZITHROMYCIN 250 MG PO TABS: ORAL_TABLET | ORAL | 0 refills | Status: DC

## 2023-09-04 MED ORDER — ALBUTEROL SULFATE HFA 108 (90 BASE) MCG/ACT IN AERS: 2.0000 | INHALATION_SPRAY | Freq: Four times a day (QID) | RESPIRATORY_TRACT | 0 refills | Status: DC | PRN

## 2023-09-04 NOTE — Progress Notes (Signed)
Subjective:  Lindsay Griffin is a 47 y.o. female who presents for Chief Complaint  Patient presents with   Cough    Cough and congestion x 5 days. Has a hx of bronchitis, smoker     Here for cough and congestion.  In past goes into bronchitis, wanted to be evaluated.   Had bad case of pneumonia in 01/2023.    Current she notes cough, chest congestion, has had headaches, sneezing, runny nose, has had some sore throat.   No ear pain.  No NVD.   No SOB or wheezing.  Is a smoker.     Currently using ibuprofen.  mucinex tends to hurt her stomach.  Generally uses robitussin.  Oldest daughter has been sick, and she was seen by doctor, and she was put on albuterol and prednisone.    Has been smoking around 30 years. Has used inhalers some in past with illness  No other aggravating or relieving factors.    No other c/o.  Past Medical History:  Diagnosis Date   Abnormal Pap smear    Acute cough 02/12/2023   Acute non-recurrent pansinusitis 12/20/2022   Acute upper respiratory infections of unspecified site    AMA (advanced maternal age) multigravida 35+    Anal fissure 08/13/2017   Anemia    per pt, several years ago as of 08/13/21   Anxiety    Arthritis    Body mass index (BMI) 21.0-21.9, adult 01/15/2022   CAP (community acquired pneumonia) 02/14/2023   Elevated LDL cholesterol level 12/30/2019   External hemorrhoids without mention of complication    Fever 02/12/2023   Flu-like symptoms 01/23/2023   Ganglion cyst 2022   right dorsal carpal ganglion cyst   H/O varicella    as a child   Lichen sclerosus    Lichen sclerosus 01/15/2022   Lichen sclerosus of vulva 01/15/2022   Major depressive disorder, single episode, severe without psychotic features (HCC)    Mastodynia    Non-recurrent acute suppurative otitis media of left ear without spontaneous rupture of tympanic membrane 02/12/2023   Other specified visual disturbances    with pregnancy   Psoriasis    Psoriatic  arthritis (HCC)    manged by Dr. Wallace Cullens   Rectal bleeding 08/13/2017   Recurrent major depression (HCC) 09/03/2014   Retention of urine, unspecified    Rubella without mention of complication    unsure if measles or allergic reaction to sulfa   Scleritis 05/14/2021   right eye   Severe major depression without psychotic features (HCC) 09/03/2014   Current Outpatient Medications on File Prior to Visit  Medication Sig Dispense Refill   conjugated estrogens (PREMARIN) 25 MG injection Inject 25 mg into the vein 2 (two) times a week.     fexofenadine (ALLEGRA) 180 MG tablet Take 180 mg by mouth daily. Pt unsure of dosage.     Polyethylene Glycol 3350 (MIRALAX PO)      No current facility-administered medications on file prior to visit.    The following portions of the patient's history were reviewed and updated as appropriate: allergies, current medications, past family history, past medical history, past social history, past surgical history and problem list.  ROS Otherwise as in subjective above    Objective: BP 110/70   Pulse 100   Temp 97.9 F (36.6 C)   Wt 114 lb (51.7 kg)   SpO2 94%   BMI 20.85 kg/m   General appearance: alert, no distress, well developed, well nourished HEENT:  normocephalic, sclerae anicteric, conjunctiva pink and moist, TMs flat, nares with turbinated edema, mucoid discharge, +erythema, pharynx with post nasal drainage Oral cavity: MMM, no lesions Neck: supple, no lymphadenopathy, no thyromegaly, no masses Heart: RRR, normal S1, S2, no murmurs Lungs: faint wheezes, decreased breath sounds, + rhonchi, or rales Pulses: 2+ radial pulses, 2+ pedal pulses, normal cap refill Ext: no edema    Assessment: Encounter Diagnoses  Name Primary?   Cough, unspecified type Yes   Smoker    Respiratory tract infection    Hx of pneumothorax      Plan: Respiratory tact infection -has good hydration, rest, begin Hycodan once or twice a day for worse cough, add  some Sudafed short-term to help with mucus and congestion since she does not tolerate Mucinex.  Begin Z-Pak.  If not much improved over the next 4 to 5 days and let me know  I encouraged frequent smoking.  She is agreeable to trial of Wellbutrin.  She got really bad nausea with Chantix in the past.  She is interested in quitting smoking.  Advised counseling as well.  Advise she follow-up in 3 to 4 weeks with Lindsay Griffin was seen today for cough.  Diagnoses and all orders for this visit:  Cough, unspecified type -     POC COVID-19 -     Influenza A/B  Smoker  Respiratory tract infection  Hx of pneumothorax  Other orders -     azithromycin (ZITHROMAX) 250 MG tablet; 2 tablets day 1, then 1 tablet days 2-4 -     HYDROcodone bit-homatropine (HYCODAN) 5-1.5 MG/5ML syrup; Take 5 mLs by mouth every 8 (eight) hours as needed for up to 5 days for cough. -     albuterol (VENTOLIN HFA) 108 (90 Base) MCG/ACT inhaler; Inhale 2 puffs into the lungs every 6 (six) hours as needed for wheezing or shortness of breath. -     buPROPion (WELLBUTRIN XL) 150 MG 24 hr tablet; Take 1 tablet (150 mg total) by mouth daily.    Follow up: 3-4 wk with PCP for recheck

## 2023-09-30 ENCOUNTER — Other Ambulatory Visit: Payer: Self-pay | Admitting: Medical

## 2023-10-28 ENCOUNTER — Other Ambulatory Visit: Payer: Self-pay

## 2023-10-28 ENCOUNTER — Encounter (HOSPITAL_BASED_OUTPATIENT_CLINIC_OR_DEPARTMENT_OTHER): Payer: Self-pay

## 2023-10-28 ENCOUNTER — Emergency Department (HOSPITAL_BASED_OUTPATIENT_CLINIC_OR_DEPARTMENT_OTHER): Payer: 59

## 2023-10-28 ENCOUNTER — Emergency Department (HOSPITAL_BASED_OUTPATIENT_CLINIC_OR_DEPARTMENT_OTHER)
Admission: EM | Admit: 2023-10-28 | Discharge: 2023-10-28 | Disposition: A | Discharge status: Home / Self Care | Payer: 59 | Attending: Emergency Medicine | Admitting: Emergency Medicine

## 2023-10-28 DIAGNOSIS — J168 Pneumonia due to other specified infectious organisms: Secondary | ICD-10-CM | POA: Diagnosis not present

## 2023-10-28 DIAGNOSIS — J189 Pneumonia, unspecified organism: Secondary | ICD-10-CM

## 2023-10-28 DIAGNOSIS — R059 Cough, unspecified: Secondary | ICD-10-CM | POA: Diagnosis present

## 2023-10-28 LAB — RESP PANEL BY RT-PCR (RSV, FLU A&B, COVID)  RVPGX2
Influenza A by PCR: NEGATIVE
Influenza B by PCR: NEGATIVE
Resp Syncytial Virus by PCR: NEGATIVE
SARS Coronavirus 2 by RT PCR: NEGATIVE

## 2023-10-28 MED ORDER — AZITHROMYCIN 250 MG PO TABS
500.0000 mg | ORAL_TABLET | Freq: Once | ORAL | Status: AC
  Administered 2023-10-28: 500 mg via ORAL
  Filled 2023-10-28: qty 2

## 2023-10-28 MED ORDER — AMOXICILLIN-POT CLAVULANATE 875-125 MG PO TABS
1.0000 | ORAL_TABLET | Freq: Once | ORAL | Status: AC
  Administered 2023-10-28: 1 via ORAL
  Filled 2023-10-28: qty 1

## 2023-10-28 MED ORDER — PREDNISONE 20 MG PO TABS
40.0000 mg | ORAL_TABLET | Freq: Once | ORAL | Status: AC
  Administered 2023-10-28: 40 mg via ORAL
  Filled 2023-10-28: qty 2

## 2023-10-28 MED ORDER — ONDANSETRON 4 MG PO TBDP
8.0000 mg | ORAL_TABLET | Freq: Once | ORAL | Status: AC
  Administered 2023-10-28: 8 mg via ORAL
  Filled 2023-10-28: qty 2

## 2023-10-28 MED ORDER — ONDANSETRON HCL 4 MG PO TABS: 4.0000 mg | ORAL_TABLET | Freq: Three times a day (TID) | ORAL | 0 refills | Status: AC | PRN

## 2023-10-28 NOTE — Discharge Instructions (Signed)
Today were seen for pneumonia.  You have been prescribed Zofran for nausea and vomiting.  I suggest taking 1 approximately 15 to 20 minutes prior to taking your antibiotics.  Please continue taking the medications prescribed to you by urgent care yesterday.  Please follow-up with your primary care in the upcoming week for further evaluation and treatment.  Thank you for letting us treat you today. After reviewing your labs and imaging, I feel you are safe to go home. Please follow up with your PCP in the next several days and provide them with your records from this visit. Return to the Emergency Room if pain becomes severe or symptoms worsen.

## 2023-10-28 NOTE — ED Triage Notes (Signed)
Body aches, fatigueHA , loss of appetite since last week. Congested cough. Dx with Pneumonia yesterday. Unable to keep antibiotics down

## 2023-10-28 NOTE — ED Provider Notes (Signed)
[place] [facility] Provider Note   CSN: [id number] Arrival date & time: 10/28/23  1138     History  Chief Complaint  Patient presents with   Pneumonia    Lindsay Griffin is a 48 y.o. female presents today for body aches, fatigue, headache, loss of appetite, cough, congestion since last week.  Patient was diagnosed with pneumonia yesterday.  Patient has been unable to keep any of her prescribed medications down.   Pneumonia Associated symptoms include headaches.       Home Medications Prior to Admission medications   Medication Sig Start Date End Date Taking? Authorizing Provider  ondansetron (ZOFRAN) 4 MG tablet Take 1 tablet (4 mg total) by mouth every 8 (eight) hours as needed for up to 10 days. 10/28/23 11/07/23 Yes Dolphus Jenny, PA-C  albuterol (VENTOLIN HFA) 108 (90 Base) MCG/ACT inhaler Inhale 2 puffs into the lungs every 6 (six) hours as needed for wheezing or shortness of breath. 09/04/23   Tysinger, Kermit Balo, PA-C  azithromycin (ZITHROMAX) 250 MG tablet 2 tablets day 1, then 1 tablet days 2-4 09/04/23   Tysinger, Kermit Balo, PA-C  buPROPion (WELLBUTRIN XL) 150 MG 24 hr tablet TAKE 1 TABLET BY MOUTH DAILY 09/30/23   Tysinger, Kermit Balo, PA-C  conjugated estrogens (PREMARIN) 25 MG injection Inject 25 mg into the vein 2 (two) times a week.    [provider]  fexofenadine (ALLEGRA) 180 MG tablet Take 180 mg by mouth daily. Pt unsure of dosage.    [provider]  Polyethylene Glycol 3350 (MIRALAX PO)     [provider]      Allergies    Sulfa antibiotics and Chantix [varenicline]    Review of Systems   Review of Systems  Constitutional:  Positive for appetite change and fatigue.  HENT:  Positive for congestion.   Respiratory:  Positive for cough.   Gastrointestinal:  Positive for nausea and vomiting.  Neurological:  Positive for headaches.    Physical Exam Updated Vital Signs BP 135/89   Pulse 66    Temp 97.7 F (36.5 C)   Resp 20   Wt 49.4 kg   LMP  (LMP Unknown)   SpO2 93%   BMI 19.94 kg/m  Physical Exam Vitals and nursing note reviewed.  Constitutional:      General: She is not in acute distress.    Appearance: She is well-developed. She is ill-appearing. She is not toxic-appearing or diaphoretic.  HENT:     Head: Normocephalic and atraumatic.     Right Ear: External ear normal.     Left Ear: External ear normal.     Nose: Congestion present.     Mouth/Throat:     Pharynx: Posterior oropharyngeal erythema present. No oropharyngeal exudate.  Eyes:     Extraocular Movements: Extraocular movements intact.     Conjunctiva/sclera: Conjunctivae normal.     Pupils: Pupils are equal, round, and reactive to light.  Cardiovascular:     Rate and Rhythm: Normal rate and regular rhythm.     Pulses: Normal pulses.     Heart sounds: Normal heart sounds. No murmur heard. Pulmonary:     Effort: Pulmonary effort is normal. No respiratory distress.     Breath sounds: Normal breath sounds. No wheezing.     Comments: Coarse breath sounds greater on left than right Abdominal:     Palpations: Abdomen is soft.     Tenderness: There is no abdominal tenderness.  Musculoskeletal:        General: No swelling.     Cervical back: Neck supple.  Skin:    General: Skin is warm and dry.     Capillary Refill: Capillary refill takes less than 2 seconds.  Neurological:     General: No focal deficit present.     Mental Status: She is alert.     Motor: No weakness.  Psychiatric:        Mood and Affect: Mood normal.     ED Results / Procedures / Treatments   Labs (all labs ordered are listed, but only abnormal results are displayed) Labs Reviewed  RESP PANEL BY RT-PCR (RSV, FLU A&B, COVID)  [id number]    EKG None  Radiology DG Chest 2 View Result Date: 10/28/2023 CLINICAL DATA:  Cough.  Congestion. EXAM: CHEST - 2 VIEW COMPARISON:  Chest radiograph dated October 27, 2023. FINDINGS: Stable  cardiomediastinal silhouette. Similar opacification in the right middle lobe and lingula. No pleural effusion or pneumothorax. No acute osseous abnormality. IMPRESSION: Similar right middle lobe and lingular opacification, which could reflect pneumonia. Followup PA and lateral chest X-ray is recommended in 3-4 weeks following trial of antibiotic therapy to ensure resolution and exclude underlying malignancy. Electronically Signed   By: Hart Robinsons M.D.   On: 10/28/2023 14:19    Procedures Procedures    Medications Ordered in ED Medications  ondansetron (ZOFRAN-ODT) disintegrating tablet 8 mg (8 mg Oral Given 10/28/23 1208)  azithromycin (ZITHROMAX) tablet 500 mg (500 mg Oral Given 10/28/23 1449)  amoxicillin-clavulanate (AUGMENTIN) 875-125 MG per tablet 1 tablet (1 tablet Oral Given 10/28/23 1449)  predniSONE (DELTASONE) tablet 40 mg (40 mg Oral Given 10/28/23 1449)    ED Course/ Medical Decision Making/ A&P                                 Medical Decision Making Amount and/or Complexity of Data Reviewed Radiology: ordered.   This patient presents to the ED with chief complaint(s) of URI symptoms with pertinent past medical history of pneumonia which further complicates the presenting complaint. The complaint involves an extensive differential diagnosis and also carries with it a high risk of complications and morbidity.    The differential diagnosis includes pneumonia, RSV, flu, COVID, URI  Additional history obtained: Records reviewed Care Everywhere/External Records  ED Course and Reassessment: Patient given Zofran and p.o. challenge Patient able to tolerate p.o. challenge, patient given first doses of prednisone, Augmentin, and azithromycin.  Independent labs interpretation:  The following labs were independently interpreted:  Respiratory panel: Negative  Independent visualization of imaging: - I independently visualized the following imaging with scope of interpretation  limited to determining acute life threatening conditions related to emergency care: Chest x-ray, which revealed Similar right middle lobe and lingular opacification, which could reflect pneumonia.   Consultation: - Consulted or discussed management/test interpretation w/ external professional: None  Consideration for admission or further workup: Considered for admission or further workup however patient is tolerating oral intake, her vital signs, physical exam, labs, and imaging are reassuring.  Patient be given outpatient course of Zofran as needed for nausea and vomiting.  Patient given her first dose of medications started at urgent care yesterday.  Patient states that she has the remaining of her original course of antibiotics and steroids still at home and she will take these as directed.        Final Clinical Impression(s) /  ED Diagnoses Final diagnoses:  Pneumonia of left lung due to infectious organism, unspecified part of lung    Rx / DC Orders ED Discharge Orders          Ordered    ondansetron (ZOFRAN) 4 MG tablet  Every 8 hours PRN        10/28/23 1515              Dolphus Jenny, PA-C 10/28/23 1516    Melene Plan, DO 10/28/23 1517

## 2023-11-24 NOTE — Progress Notes (Unsigned)
 No chief complaint on file.  She went to [facility] on 3/4 with 5-days of sore throat, 2-day history of headache and bodyaches. There has been no known recent fever. Minimal cough.  She had negative tests for influenza, COVID and strep. She was treated with Solu-Medrol injection. She was not given any antibiotic.  She reports now that her mucus is green.  ***  She had pneumonia in February, treated with Augmentin and Zithromax.  CXR 10/27/23: IMPRESSION:  Right middle lobe and lingular opacification, findings which may be  due to pneumonia. Followup PA and lateral chest X-ray is recommended  in 3-4 weeks following trial of antibiotic therapy to ensure  resolution and exclude underlying malignancy.   Hasn't had f/u CXR.  She has had CT's for chronic cough, last of which was in 05/2023: IMPRESSION: 1. Right base ground-glass opacities have resolved. 2. No acute cardiopulmonary process. 3. Right-sided nephrolithiasis partially imaged.   Smokes 1 pack of cigarettes daily.   Average packs/day: 1 pack/day for 31.2 years (31.2 ttl pk-yrs)  Types: Cigarettes  Start date: 09/16/1992    PMH, PSH, SH reviewed   ROS:    PHYSICAL EXAM:  LMP  (LMP Unknown)       ASSESSMENT/PLAN:  Needs CXR, for f/u pneumonia (10/2023)

## 2023-11-26 ENCOUNTER — Encounter: Payer: Self-pay | Admitting: Family Medicine

## 2023-11-26 ENCOUNTER — Ambulatory Visit: Admitting: Family Medicine

## 2023-11-26 VITALS — BP 138/88 | HR 96 | Temp 97.9°F | Ht 62.0 in | Wt 110.2 lb

## 2023-11-26 DIAGNOSIS — Z8701 Personal history of pneumonia (recurrent): Secondary | ICD-10-CM | POA: Diagnosis not present

## 2023-11-26 DIAGNOSIS — R058 Other specified cough: Secondary | ICD-10-CM | POA: Diagnosis not present

## 2023-11-26 DIAGNOSIS — F17200 Nicotine dependence, unspecified, uncomplicated: Secondary | ICD-10-CM

## 2023-11-26 MED ORDER — DOXYCYCLINE HYCLATE 100 MG PO TABS
100.0000 mg | ORAL_TABLET | Freq: Two times a day (BID) | ORAL | 0 refills | Status: DC
Start: 2023-11-26 — End: 2024-05-31

## 2023-11-26 NOTE — Patient Instructions (Addendum)
 Drink plenty of water. Consider switching from Robitussin DM to a longer-acting medication with same ingredients--Mucinex MD 12 hour tablets, and take it twice daily. Take the antibiotic twice daily.  Use sunscreen if in the sun.  Go to [facility] at [employer] sometime in the next week for a follow-up chest x-ray, to ensure that your pneumonia from February has resolved.  I recommend setting a quit date for smoking, and sharing it with others. Consider using patches along with short-acting nicotine replacement (lozenges or gum) to help you be successful. Think about why and when you smoke, to help come up with coping skills. We discussed pairing it with exercise, and keeping healthy crunchy snacks around--celery and carrots.

## 2023-12-30 ENCOUNTER — Other Ambulatory Visit: Payer: Self-pay | Admitting: Medical

## 2024-03-16 ENCOUNTER — Encounter: Payer: Self-pay | Admitting: Nurse Practitioner

## 2024-05-31 ENCOUNTER — Encounter: Payer: Self-pay | Admitting: Nurse Practitioner

## 2024-05-31 ENCOUNTER — Ambulatory Visit: Payer: 59 | Admitting: Nurse Practitioner

## 2024-05-31 VITALS — BP 122/80 | HR 76 | Ht 62.5 in | Wt 120.8 lb

## 2024-05-31 DIAGNOSIS — Z23 Encounter for immunization: Secondary | ICD-10-CM | POA: Diagnosis not present

## 2024-05-31 DIAGNOSIS — M069 Rheumatoid arthritis, unspecified: Secondary | ICD-10-CM | POA: Diagnosis not present

## 2024-05-31 DIAGNOSIS — Z87891 Personal history of nicotine dependence: Secondary | ICD-10-CM

## 2024-05-31 DIAGNOSIS — K5904 Chronic idiopathic constipation: Secondary | ICD-10-CM

## 2024-05-31 DIAGNOSIS — L4050 Arthropathic psoriasis, unspecified: Secondary | ICD-10-CM | POA: Diagnosis not present

## 2024-05-31 DIAGNOSIS — E7800 Pure hypercholesterolemia, unspecified: Secondary | ICD-10-CM

## 2024-05-31 DIAGNOSIS — Z0000 Encounter for general adult medical examination without abnormal findings: Secondary | ICD-10-CM | POA: Diagnosis not present

## 2024-05-31 DIAGNOSIS — K644 Residual hemorrhoidal skin tags: Secondary | ICD-10-CM

## 2024-05-31 DIAGNOSIS — Z1231 Encounter for screening mammogram for malignant neoplasm of breast: Secondary | ICD-10-CM

## 2024-05-31 DIAGNOSIS — N952 Postmenopausal atrophic vaginitis: Secondary | ICD-10-CM

## 2024-05-31 LAB — CBC WITH DIFFERENTIAL/PLATELET
Basophils Absolute: 0 x10E3/uL (ref 0.0–0.2)
Basos: 1 %
EOS (ABSOLUTE): 0.1 x10E3/uL (ref 0.0–0.4)
Eos: 1 %
Hematocrit: 41.7 % (ref 34.0–46.6)
Hemoglobin: 13.7 g/dL (ref 11.1–15.9)
Immature Grans (Abs): 0 x10E3/uL (ref 0.0–0.1)
Immature Granulocytes: 0 %
Lymphocytes Absolute: 1.8 x10E3/uL (ref 0.7–3.1)
Lymphs: 21 %
MCH: 31.5 pg (ref 26.6–33.0)
MCHC: 32.9 g/dL (ref 31.5–35.7)
MCV: 96 fL (ref 79–97)
Monocytes Absolute: 0.6 x10E3/uL (ref 0.1–0.9)
Monocytes: 8 %
Neutrophils Absolute: 5.8 x10E3/uL (ref 1.4–7.0)
Neutrophils: 68 %
Platelets: 317 x10E3/uL (ref 150–450)
RBC: 4.35 x10E6/uL (ref 3.77–5.28)
RDW: 11.8 % (ref 11.7–15.4)
WBC: 8.4 x10E3/uL (ref 3.4–10.8)

## 2024-05-31 LAB — COMPREHENSIVE METABOLIC PANEL WITH GFR
ALT: 10 IU/L (ref 0–32)
AST: 16 IU/L (ref 0–40)
Albumin: 4.5 g/dL (ref 3.9–4.9)
Alkaline Phosphatase: 74 IU/L (ref 41–116)
BUN/Creatinine Ratio: 13 (ref 9–23)
BUN: 10 mg/dL (ref 6–24)
Bilirubin Total: 0.7 mg/dL (ref 0.0–1.2)
CO2: 22 mmol/L (ref 20–29)
Calcium: 9.4 mg/dL (ref 8.7–10.2)
Chloride: 100 mmol/L (ref 96–106)
Creatinine, Ser: 0.76 mg/dL (ref 0.57–1.00)
Globulin, Total: 2.2 g/dL (ref 1.5–4.5)
Glucose: 78 mg/dL (ref 70–99)
Potassium: 4.6 mmol/L (ref 3.5–5.2)
Sodium: 137 mmol/L (ref 134–144)
Total Protein: 6.7 g/dL (ref 6.0–8.5)
eGFR: 97 mL/min/1.73 (ref >59)

## 2024-05-31 LAB — LIPID PANEL
Chol/HDL Ratio: 2.5 ratio (ref 0.0–4.4)
Cholesterol, Total: 209 mg/dL — ABNORMAL HIGH (ref 100–199)
HDL: 82 mg/dL (ref >39)
LDL Chol Calc (NIH): 112 mg/dL — ABNORMAL HIGH (ref 0–99)
Triglycerides: 84 mg/dL (ref 0–149)
VLDL Cholesterol Cal: 15 mg/dL (ref 5–40)

## 2024-05-31 MED ORDER — BUPROPION HCL ER (XL) 150 MG PO TB24: 150.0000 mg | ORAL_TABLET | Freq: Every day | ORAL | 3 refills | Status: AC

## 2024-05-31 MED ORDER — COVID-19 MRNA VAC-TRIS(PFIZER) 30 MCG/0.3ML IM SUSY: 0.3000 mL | PREFILLED_SYRINGE | Freq: Once | INTRAMUSCULAR | 0 refills | Status: AC

## 2024-05-31 MED ORDER — PREMARIN 0.625 MG/GM VA CREA: TOPICAL_CREAM | VAGINAL | 3 refills | Status: AC

## 2024-05-31 NOTE — Assessment & Plan Note (Signed)
 Not currently on chronic therapy for management. Previously on biologics with Dr. Neysa at [facility]. Will continue to monitor.

## 2024-05-31 NOTE — Assessment & Plan Note (Signed)
 Perimenopausal symptoms including vaginal dryness, irregular periods, and hot flashes. Premarin  cream used intermittently, resulting in improvement of symptoms. Discussed natural progression of menopause and symptom management. - Continue Premarin  cream as prescribed.

## 2024-05-31 NOTE — Assessment & Plan Note (Signed)
 CPE completed today. Review of HM activities and recommendations discussed and provided on AVS. Anticipatory guidance, diet, and exercise recommendations provided. Medications, allergies, and hx reviewed and updated as necessary. Orders placed as listed below.  Plan: - Labs ordered. Will make changes as necessary based on results.  - I will review these results and send recommendations via MyChart or a telephone call.  - Mammogram ordered - F/U with CPE in 1 year or sooner for acute/chronic health needs as directed.

## 2024-05-31 NOTE — Assessment & Plan Note (Signed)
 Chronic with no significant concerns expressed today. Managing constipation with miralax with no issues at this time. Colonoscopy is up to date. Will continue to monitor.

## 2024-05-31 NOTE — Progress Notes (Signed)
 Catheline Doing, DNP, AGNP-c [facility] [address] [place], [place] [address] Main Office [phone] VISIT TYPE: CPE on 05/31/2024 Today's Vitals   05/31/24 0924  BP: 122/80  Pulse: 76  Weight: 120 lb 12.8 oz (54.8 kg)  Height: 5' 2.5 (1.588 m)   Body mass index is 21.74 kg/m. BP 122/80   Pulse 76   Ht 5' 2.5 (1.588 m)   Wt 120 lb 12.8 oz (54.8 kg)   BMI 21.74 kg/m   Subjective:    Patient ID: Lindsay Griffin, female    DOB: 1976/06/18, 48 y.o.   MRN: [id number]  HPI:  History of Present Illness Lindsay Griffin is a 48 year old female who presents for her annual exam.   She experiences significant right hip pain, which has progressively worsened. The pain is exacerbated by activities such as getting into her car and climbing stairs, often resulting in a 'jamming' sensation. She has a bone spur in her hip that she describes as 'tearing at my tendon'. Previous treatments, including a guided ultrasound shot and cortisone injections, have not provided relief. She is anticipating the need for surgery in the future.   She recently quit smoking, encouraged by her husband, and transitioned to using a vape, which has helped her reduce her nicotine  intake significantly. She experiences headaches, which she attributes to nicotine  withdrawal, but does not feel irritable with the use of the vape. She is using a vape with a low nicotine  level and plans to gradually reduce her nicotine  intake further. No increase in coughing since quitting smoking.  She has a history of nasal issues, including surgery on her turbinates due to chronic inflammation, possibly exacerbated by smoking and exposure to mold and mildew at a previous workplace. She experiences difficulty breathing through her left nostril due to swelling at times and finds this frustrating.  She has a history of internal and external hemorrhoids, which have been problematic since she was 17. She has had them  banded, but they returned. She manages constipation with daily Miralax, which helps prevent severe symptoms. She has not had any changes to her bowel habits. She is up to date on her colonoscopy.   She is currently taking Wellbutrin , which she started in January to help with smoking cessation, but she feels it has not been effective for this purpose. She does feel this has been beneficial for her mood and wishes to continue.    She also uses Premarin  cream intravaginally for atrophy. Since restarting, her periods have become more regular. Experiences occasional hot flashes and night sweats, indicating possible perimenopausal symptoms.  Pertinent items are noted in HPI.  Most Recent Depression Screen:     05/31/2024    9:21 AM 05/22/2023   10:43 AM 01/15/2022    9:47 AM 01/04/2021    9:46 AM 01/04/2021    9:45 AM  Depression screen PHQ 2/9  Decreased Interest 0 0 0 0 0  Down, Depressed, Hopeless 0 0 0 0 0  PHQ - 2 Score 0 0 0 0 0  Altered sleeping    0   Tired, decreased energy    0   Change in appetite    0   Feeling bad or failure about yourself     0   Trouble concentrating    0   Moving slowly or fidgety/restless    0   Suicidal thoughts    0   PHQ-9 Score    0   Difficult doing  work/chores    Not difficult at all    Most Recent Anxiety Screen:      No data to display         Most Recent Fall Screen:    05/31/2024    9:21 AM 05/22/2023   10:42 AM 01/15/2022    9:46 AM 01/04/2021    9:45 AM 12/29/2019    9:07 AM  Fall Risk   Falls in the past year? 0 0 0 0 0   Number falls in past yr: 0 0 0 0 0  Injury with Fall? 0 0 0 0 0  Risk for fall due to : No Fall Risks  No Fall Risks No Fall Risks   Follow up Falls evaluation completed  Falls evaluation completed  Falls evaluation completed       Data saved with a previous flowsheet row definition    Past medical history, surgical history, medications, allergies, family history and social history reviewed with patient today and  changes made to appropriate areas of the chart.  Past Medical History:  Past Medical History:  Diagnosis Date   Abnormal Pap smear    Acute cough 02/12/2023   Acute non-recurrent pansinusitis 12/20/2022   Acute upper respiratory infections of unspecified site    AMA (advanced maternal age) multigravida 35+    Anal fissure 08/13/2017   Anemia    per pt, several years ago as of 08/13/21   Anxiety    Arthritis    Body mass index (BMI) 21.0-21.9, adult 01/15/2022   CAP (community acquired pneumonia) 02/14/2023   Elevated LDL cholesterol level 12/30/2019   External hemorrhoids without mention of complication    Fever 02/12/2023   Flu-like symptoms 01/23/2023   Ganglion cyst 2022   right dorsal carpal ganglion cyst   H/O varicella    as a child   Lichen sclerosus    Lichen sclerosus 01/15/2022   Lichen sclerosus of vulva 01/15/2022   Major depressive disorder, single episode, severe without psychotic features (HCC)    Mastodynia    Non-recurrent acute suppurative otitis media of left ear without spontaneous rupture of tympanic membrane 02/12/2023   Other specified visual disturbances    with pregnancy   Psoriasis    Psoriatic arthritis (HCC)    manged by Dr. Elnor   Rectal bleeding 08/13/2017   Recurrent major depression (HCC) 09/03/2014   Retention of urine, unspecified    Rubella without mention of complication    unsure if measles or allergic reaction to sulfa   Scleritis 05/14/2021   right eye   Severe major depression without psychotic features (HCC) 09/03/2014   Medications:  Current Outpatient Medications on File Prior to Visit  Medication Sig   fexofenadine (ALLEGRA) 180 MG tablet Take 180 mg by mouth daily. Pt unsure of dosage.   fluticasone (FLONASE SENSIMIST) 27.5 MCG/SPRAY nasal spray Place 2 sprays into the nose daily.   Polyethylene Glycol 3350 (MIRALAX PO)    No current facility-administered medications on file prior to visit.   Surgical History:  Past  Surgical History:  Procedure Laterality Date   BREAST BIOPSY Left 04/28/2017   COLONOSCOPY  10/03/2017   CRYOTHERAPY  1994   cryotherapy of vaginal lesion   DILATION AND CURETTAGE OF UTERUS  07/17/2010   FOOT SURGERY     R x2   GANGLION CYST EXCISION Right 08/16/2021   Procedure: Right dorsal carpal ganglion cyst excision;  Surgeon: Alyse Agent, MD;  Location: [facility];  Service: Orthopedics;  Laterality: Right;  with local anesthesia   HEMORRHOID BANDING  01/07/2018   LASIK     SEPTOPLASTY  2019   Allergies:  Allergies  Allergen Reactions   Sulfa Antibiotics Hives, Rash and Other (See Comments)    As a child; Occurred in Season [place] Childhood    Chantix [Varenicline]     nausea   Family History:  Family History  Problem Relation Age of Onset   Pancreatitis Father    Diabetes Mother    Cancer Mother        cervical   Hypertension Mother    Colon polyps Mother    Arthritis Mother    Stroke Maternal Grandmother    Heart disease Maternal Grandmother    Cancer Maternal Grandfather        colon   Colon cancer Maternal Grandfather    Diabetes Paternal Grandfather    Breast cancer Neg Hx        Objective:    BP 122/80   Pulse 76   Ht 5' 2.5 (1.588 m)   Wt 120 lb 12.8 oz (54.8 kg)   BMI 21.74 kg/m   Wt Readings from Last 3 Encounters:  05/31/24 120 lb 12.8 oz (54.8 kg)  11/26/23 110 lb 3.2 oz (50 kg)  10/28/23 109 lb (49.4 kg)    Physical Exam Vitals and nursing note reviewed.  Constitutional:      General: She is not in acute distress.    Appearance: Normal appearance.  HENT:     Head: Normocephalic and atraumatic.     Right Ear: Hearing, tympanic membrane, ear canal and external ear normal.     Left Ear: Hearing, tympanic membrane, ear canal and external ear normal.     Nose: Nose normal.     Right Sinus: No maxillary sinus tenderness or frontal sinus tenderness.     Left Sinus: No maxillary sinus tenderness or frontal sinus tenderness.      Mouth/Throat:     Lips: Pink.     Mouth: Mucous membranes are moist.     Pharynx: Oropharynx is clear.  Eyes:     General: Lids are normal. Vision grossly intact.     Extraocular Movements: Extraocular movements intact.     Conjunctiva/sclera: Conjunctivae normal.     Pupils: Pupils are equal, round, and reactive to light.     Funduscopic exam:    Right eye: Red reflex present.        Left eye: Red reflex present.    Visual Fields: Right eye visual fields normal and left eye visual fields normal.  Neck:     Thyroid: No thyromegaly.     Vascular: No carotid bruit.  Cardiovascular:     Rate and Rhythm: Normal rate and regular rhythm.     Chest Wall: PMI is not displaced.     Pulses: Normal pulses.          Dorsalis pedis pulses are 2+ on the right side and 2+ on the left side.       Posterior tibial pulses are 2+ on the right side and 2+ on the left side.     Heart sounds: Normal heart sounds. No murmur heard. Pulmonary:     Effort: Pulmonary effort is normal. No respiratory distress.     Breath sounds: Rhonchi present.     Comments: Bilateral rhonchi in the bases with the right > left.  Abdominal:     General: Abdomen is flat. Bowel sounds are normal. There is no  distension.     Palpations: Abdomen is soft. There is no hepatomegaly, splenomegaly or mass.     Tenderness: There is no abdominal tenderness. There is no right CVA tenderness, left CVA tenderness, guarding or rebound.  Musculoskeletal:     Cervical back: Full passive range of motion without pain, normal range of motion and neck supple. No tenderness.     Right lower leg: No edema.     Left lower leg: No edema.     Comments: Chronic right hip pain with decreased ROM and strength.   Feet:     Left foot:     Toenail Condition: Left toenails are normal.  Lymphadenopathy:     Cervical: No cervical adenopathy.     Upper Body:     Right upper body: No supraclavicular adenopathy.     Left upper body: No supraclavicular  adenopathy.  Skin:    General: Skin is warm and dry.     Capillary Refill: Capillary refill takes less than 2 seconds.     Nails: There is no clubbing.  Neurological:     General: No focal deficit present.     Mental Status: She is alert and oriented to person, place, and time.     GCS: GCS eye subscore is 4. GCS verbal subscore is 5. GCS motor subscore is 6.     Sensory: Sensation is intact.     Motor: Motor function is intact.     Coordination: Coordination is intact.     Gait: Gait is intact.     Deep Tendon Reflexes: Reflexes are normal and symmetric.  Psychiatric:        Attention and Perception: Attention normal.        Mood and Affect: Mood normal.        Speech: Speech normal.        Behavior: Behavior normal. Behavior is cooperative.        Thought Content: Thought content normal.        Cognition and Memory: Cognition and memory normal.        Judgment: Judgment normal.     Results for orders placed or performed during the hospital encounter of 10/28/23  Resp panel by RT-PCR (RSV, Flu A&B, Covid) Anterior Nasal Swab   Collection Time: 10/28/23 11:55 AM   Specimen: Anterior Nasal Swab  Result Value Ref Range   SARS Coronavirus 2 by RT PCR NEGATIVE NEGATIVE   Influenza A by PCR NEGATIVE NEGATIVE   Influenza B by PCR NEGATIVE NEGATIVE   Resp Syncytial Virus by PCR NEGATIVE NEGATIVE       Assessment & Plan:   Problem List Items Addressed This Visit     External hemorrhoids   Chronic with no significant concerns expressed today. Managing constipation with miralax with no issues at this time. Colonoscopy is up to date. Will continue to monitor.       Psoriatic arthritis (HCC)   Not currently on chronic therapy for management. Previously on biologics with Dr. Neysa at [facility]. Will continue to monitor.       Relevant Orders   CBC with Differential/Platelet   Comprehensive metabolic panel with GFR   Lipid panel   Quit smoking within past year   Nicotine   dependence in remission. Successfully quit smoking with the aid of vaping. Reduction in nicotine  and chemical exposure with vaping compared to smoking. Using a low nicotine  vape and plans to taper off over time. Discussed potential withdrawal symptoms and gradual reduction of  nicotine  levels. - Continue tapering off nicotine  vape gradually.      Relevant Medications   buPROPion  (WELLBUTRIN  XL) 150 MG 24 hr tablet   Chronic idiopathic constipation   Chronic constipation managed with daily Miralax. Recurrent internal hemorrhoids, previously banded but recurred. Advised against surgical intervention due to high recurrence and pain risk. - Advise to avoid straining and continue Miralax to manage symptoms.      Rheumatoid arthritis (HCC)   Not currently on chronic therapy for management. Previously on biologics with Dr. Neysa at [facility]. Will continue to monitor.       Relevant Orders   CBC with Differential/Platelet   Comprehensive metabolic panel with GFR   Lipid panel   Encounter for annual physical exam - Primary   CPE completed today. Review of HM activities and recommendations discussed and provided on AVS. Anticipatory guidance, diet, and exercise recommendations provided. Medications, allergies, and hx reviewed and updated as necessary. Orders placed as listed below.  Plan: - Labs ordered. Will make changes as necessary based on results.  - I will review these results and send recommendations via MyChart or a telephone call.  - Mammogram ordered - F/U with CPE in 1 year or sooner for acute/chronic health needs as directed.        Relevant Orders   CBC with Differential/Platelet   Comprehensive metabolic panel with GFR   Lipid panel   Vaginal atrophy   Perimenopausal symptoms including vaginal dryness, irregular periods, and hot flashes. Premarin  cream used intermittently, resulting in improvement of symptoms. Discussed natural progression of menopause and symptom management. -  Continue Premarin  cream as prescribed.       Relevant Medications   conjugated estrogens  (PREMARIN ) vaginal cream   Elevated LDL cholesterol level   Relevant Orders   CBC with Differential/Platelet   Comprehensive metabolic panel with GFR   Lipid panel   Other Visit Diagnoses       Need for Tdap vaccination         Need for influenza vaccination       Relevant Orders   Flu vaccine trivalent PF, 6mos and older(Flulaval,Afluria,Fluarix,Fluzone) (Completed)     Screening mammogram for breast cancer         Need for COVID-19 vaccine       Relevant Medications   COVID-19 mRNA vaccine, Pfizer, (COMIRNATY) syringe       Assessment and Plan Assessment & Plan       Follow up plan: Return in about 1 year (around 05/31/2025) for CPE.  NEXT PREVENTATIVE PHYSICAL DUE IN 1 YEAR.  PATIENT COUNSELING PROVIDED FOR ALL ADULT PATIENTS: A well balanced diet low in saturated fats, cholesterol, and moderation in carbohydrates.  This can be as simple as monitoring portion sizes and cutting back on sugary beverages such as soda and juice to start with.    Daily water consumption of at least 64 ounces.  Physical activity at least 180 minutes per week.  If just starting out, start 10 minutes a day and work your way up.   This can be as simple as taking the stairs instead of the elevator and walking 2-3 laps around the office  purposefully every day.   STD protection, partner selection, and regular testing if high risk.  Limited consumption of alcoholic beverages if alcohol is consumed. For men, I recommend no more than 14 alcoholic beverages per week, spread out throughout the week (max 2 per day). Avoid binge drinking or consuming large quantities of alcohol  in one setting.  Please let me know if you feel you may need help with reduction or quitting alcohol consumption.   Avoidance of nicotine , if used. Please let me know if you feel you may need help with reduction or quitting  nicotine  use.   Daily mental health attention. This can be in the form of 5 minute daily meditation, prayer, journaling, yoga, reflection, etc.  Purposeful attention to your emotions and mental state can significantly improve your overall wellbeing  and  Health.  Please know that I am here to help you with all of your health care goals and am happy to work with you to find a solution that works best for you.  The greatest advice I have received with any changes in life are to take it one step at a time, that even means if all you can focus on is the next 60 seconds, then do that and celebrate your victories.  With any changes in life, you will have set backs, and that is OK. The important thing to remember is, if you have a set back, it is not a failure, it is an opportunity to try again! Screening Testing Mammogram Every 1 -2 years based on history and risk factors Starting at age 59 Pap Smear Ages 21-39 every 3 years Ages 4-65 every 5 years with HPV testing More frequent testing may be required based on results and history Colon Cancer Screening Every 1-10 years based on test performed, risk factors, and history Starting at age 42 Bone Density Screening Every 2-10 years based on history Starting at age 37 for women Recommendations for men differ based on medication usage, history, and risk factors AAA Screening One time ultrasound Men 88-69 years old who have every smoked Lung Cancer Screening Low Dose Lung CT every 12 months Age 54-80 years with a 30 pack-year smoking history who still smoke or who have quit within the last 15 years   Screening Labs Routine  Labs: Complete Blood Count (CBC), Complete Metabolic Panel (CMP), Cholesterol (Lipid Panel) Every 6-12 months based on history and medications May be recommended more frequently based on current conditions or previous results Hemoglobin A1c Lab Every 3-12 months based on history and previous results Starting at age 59 or  earlier with diagnosis of diabetes, high cholesterol, BMI >26, and/or risk factors Frequent monitoring for patients with diabetes to ensure blood sugar control Thyroid Panel (TSH) Every 6 months based on history, symptoms, and risk factors May be repeated more often if on medication HIV One time testing for all patients 39 and older May be repeated more frequently for patients with increased risk factors or exposure Hepatitis C One time testing for all patients 50 and older May be repeated more frequently for patients with increased risk factors or exposure Gonorrhea, Chlamydia Every 12 months for all sexually active persons 13-24 years Additional monitoring may be recommended for those who are considered high risk or who have symptoms Every 12 months for any woman on birth control, regardless of sexual activity PSA Men 36-77 years old with risk factors Additional screening may be recommended from age 68-69 based on risk factors, symptoms, and history  Vaccine Recommendations Tetanus Booster All adults every 10 years Flu Vaccine All patients 6 months and older every year COVID Vaccine All patients 12 years and older Initial dosing with booster May recommend additional booster based on age and health history HPV Vaccine 2 doses all patients age 34-26 Dosing may be considered for patients  over 26 Shingles Vaccine (Shingrix) 2 doses all adults 55 years and older Pneumonia (Pneumovax 23) All adults 65 years and older May recommend earlier dosing based on health history One year apart from Prevnar 13 Pneumonia (Prevnar 65) All adults 65 years and older Dosed 1 year after Pneumovax 23 Pneumonia (Prevnar 20 ) One time alternative to the two dosing of 13 and 23 For all adults with initial dose of 23, 20 is recommended 1 year later For all adults with initial dose of 13, 23 is still recommended as second option 1 year later

## 2024-05-31 NOTE — Assessment & Plan Note (Signed)
 Chronic constipation managed with daily Miralax. Recurrent internal hemorrhoids, previously banded but recurred. Advised against surgical intervention due to high recurrence and pain risk. - Advise to avoid straining and continue Miralax to manage symptoms.

## 2024-05-31 NOTE — Patient Instructions (Addendum)
 I have sent in refills of the wellbutrin  and premarin  for you to the pharmacy. These should be good for a year.  When you get your pap and mammogram, please ask them to send the results to us  so we can update this.   Congratulations on quitting smoking!! This is wonderful!   You no longer need the prescription for the COVID vaccine! You can let them know that you have a history of high cholesterol if they ask for a medical condition.

## 2024-05-31 NOTE — Assessment & Plan Note (Signed)
 Nicotine  dependence in remission. Successfully quit smoking with the aid of vaping. Reduction in nicotine  and chemical exposure with vaping compared to smoking. Using a low nicotine  vape and plans to taper off over time. Discussed potential withdrawal symptoms and gradual reduction of nicotine  levels. - Continue tapering off nicotine  vape gradually.

## 2024-06-02 ENCOUNTER — Ambulatory Visit: Payer: Self-pay | Admitting: Nurse Practitioner

## 2024-06-05 ENCOUNTER — Other Ambulatory Visit: Payer: Self-pay | Admitting: Medical

## 2024-06-05 DIAGNOSIS — Z87891 Personal history of nicotine dependence: Secondary | ICD-10-CM

## 2024-06-16 ENCOUNTER — Encounter: Payer: Self-pay | Admitting: Family Medicine

## 2024-06-16 ENCOUNTER — Telehealth: Admitting: Family Medicine

## 2024-06-16 ENCOUNTER — Ambulatory Visit: Payer: Self-pay

## 2024-06-16 DIAGNOSIS — U071 COVID-19: Secondary | ICD-10-CM

## 2024-06-16 MED ORDER — NIRMATRELVIR/RITONAVIR (PAXLOVID)TABLET: 3.0000 | ORAL_TABLET | Freq: Two times a day (BID) | ORAL | 0 refills | Status: AC

## 2024-06-16 NOTE — Telephone Encounter (Signed)
 FYI Only or Action Required?: FYI only for provider.  Patient was last seen in primary care on 05/31/2024 by Early, Camie BRAVO, NP.  Called Nurse Triage reporting Covid Positive.  Symptoms began today.  Interventions attempted: Nothing.  Symptoms are: gradually worsening.  Triage Disposition: Call PCP Within 24 Hours  Patient/caregiver understands and will follow disposition?: Yes     Copied from CRM #[id number]. Topic: Clinical - Red Word Triage >> Jun 16, 2024  9:21 AM Wess RAMAN wrote: Red Word that prompted transfer to Nurse Triage: Patient tested positive for Covid and it not feeling well. Bad headaches, body aches, chills, Nausea   Pharmacy: [facility] [id number] - [place], [place] - [address] [place] [address] Phone: [phone] Fax: [phone] Hours: Not open 24 hours Reason for Disposition  [1] Patient is NOT HIGH RISK AND [2] strongly requests antiviral medicine AND [3] COVID-19 symptoms present < 5 days  Answer Assessment - Initial Assessment Questions Pt tested positive this am.  Pt tested positive 3 days ago and was given paxlovid and is starting to feel better   1. SYMPTOMS: What is your main symptom or concern? (e.g., cough, fever, shortness of breath, muscle aches)    Headache 2. ONSET: When did the symptoms start?      Sniffles yesterday 3. COUGH: Do you have a cough? If Yes, ask: How bad is the cough?       Starting to have a cough 4. FEVER: Do you have a fever? If Yes, ask: What is your temperature, how was it measured, and when did it start?     She hasn't checked but is sweaty and had chills 5. BREATHING DIFFICULTY: Are you having any difficulty breathing? (e.g., normal; shortness of breath, wheezing, unable to speak)      no 6. BETTER-SAME-WORSE: Are you getting better, staying the same or getting worse compared to yesterday?  If getting worse, ask, In what way?     Worse than yesterday 7.  OTHER SYMPTOMS: Do you have any other symptoms?  (e.g., chills, fatigue, headache, loss of smell or taste, muscle pain, sore throat)     Headaches, body aches, sweating, chills  8. COVID-19 DIAGNOSIS: How do you know that you have COVID? (e.g., positive lab test or self-test, diagnosed by doctor or NP/PA, symptoms after exposure).     Positive home test 9. COVID-19 EXPOSURE: Was there any known exposure to COVID before the symptoms began?      husband 10. COVID-19 VACCINE: Have you had the COVID-19 vaccine? If Yes, ask: When did you last get it?       No covid vaccine this year 11. HIGH RISK DISEASE: Do you have any chronic medical problems? (e.g., asthma, heart or lung disease, weak immune system, obesity, etc.)       Psoriatic arthritis  Protocols used: COVID-19 - Diagnosed or Suspected-A-AH

## 2024-06-16 NOTE — Progress Notes (Signed)
   Name: Lindsay Griffin   Date of Visit: 06/16/24   Date of last visit with me: Visit date not found   CHIEF COMPLAINT:  Chief Complaint  Patient presents with   Acute Visit    Postive covid       Both patient and doctor are located in the state of [place]   HPI:  Discussed the use of AI scribe software for clinical note transcription with the patient, who gave verbal consent to proceed.  History of Present Illness Lindsay Griffin is a 48 year old female who presents with COVID-19 symptoms.  Symptoms began yesterday with a dull headache, which progressed to a pounding headache, chills, and body aches by 2:30 AM today. She describes these symptoms as 'pretty bad'.  Her husband tested positive for COVID-19 on Monday. She has had COVID-19 before, although she did not specify when.  No shortness of breath, but she has developed a cough and nasal congestion. She has a history of allergies and is currently taking Allegra and using Flonase.     OBJECTIVE:       05/31/2024    9:21 AM  Depression screen PHQ 2/9  Decreased Interest 0  Down, Depressed, Hopeless 0  PHQ - 2 Score 0     BP Readings from Last 3 Encounters:  05/31/24 122/80  11/26/23 138/88  10/28/23 126/80    There were no vitals taken for this visit.   Physical Exam    Physical Exam Constitutional:      Appearance: Normal appearance.  Neurological:     Mental Status: She is alert.     ASSESSMENT/PLAN:   Assessment & Plan COVID-19    Assessment and Plan Assessment & Plan COVID-19 infection Acute COVID-19 infection with symptoms starting yesterday. Eligible for Paxlovid treatment. - Prescribe Paxlovid and send prescription to [facility]. - Advise to monitor for shortness of breath and seek medical attention if it occurs. - Instruct to alternate between 1000 mg of acetaminophen  and 600 mg of ibuprofen  every four hours for myalgia. - Encourage hydration with water,  Gatorade, or Pedialyte. - Advise to contact the clinic if symptoms do not improve after taking Paxlovid, as corticosteroids may be considered.  Allergic rhinitis Chronic allergic rhinitis managed with Allegra and Flonase. - Continue Allegra and Flonase as previously prescribed.     Kelee Cunningham A. Vita MD [facility]

## 2024-06-25 DIAGNOSIS — M25551 Pain in right hip: Secondary | ICD-10-CM | POA: Diagnosis not present

## 2024-07-09 DIAGNOSIS — M25851 Other specified joint disorders, right hip: Secondary | ICD-10-CM | POA: Diagnosis not present

## 2024-07-09 DIAGNOSIS — S73191A Other sprain of right hip, initial encounter: Secondary | ICD-10-CM | POA: Diagnosis not present

## 2024-07-09 DIAGNOSIS — M7061 Trochanteric bursitis, right hip: Secondary | ICD-10-CM | POA: Diagnosis not present

## 2024-08-03 DIAGNOSIS — M25551 Pain in right hip: Secondary | ICD-10-CM | POA: Diagnosis not present

## 2024-08-03 DIAGNOSIS — M25851 Other specified joint disorders, right hip: Secondary | ICD-10-CM | POA: Diagnosis not present

## 2024-08-17 DIAGNOSIS — M25551 Pain in right hip: Secondary | ICD-10-CM | POA: Diagnosis not present

## 2024-08-17 DIAGNOSIS — M25851 Other specified joint disorders, right hip: Secondary | ICD-10-CM | POA: Diagnosis not present

## 2024-08-24 DIAGNOSIS — M25851 Other specified joint disorders, right hip: Secondary | ICD-10-CM | POA: Diagnosis not present

## 2024-08-24 DIAGNOSIS — M25551 Pain in right hip: Secondary | ICD-10-CM | POA: Diagnosis not present

## 2024-08-31 DIAGNOSIS — M25551 Pain in right hip: Secondary | ICD-10-CM | POA: Diagnosis not present

## 2024-08-31 DIAGNOSIS — M25851 Other specified joint disorders, right hip: Secondary | ICD-10-CM | POA: Diagnosis not present

## 2025-06-03 ENCOUNTER — Encounter: Payer: Self-pay | Admitting: Nurse Practitioner
